# Patient Record
Sex: Female | Born: 1995 | Race: White | Hispanic: No | Marital: Single | Smoking: Current every day smoker
Health system: Southern US, Community
[De-identification: ages and names within clinical notes are randomized; demographics above are authoritative.]

## PROBLEM LIST (undated history)

## (undated) DIAGNOSIS — R109 Unspecified abdominal pain: Secondary | ICD-10-CM

## (undated) DIAGNOSIS — F32A Depression, unspecified: Secondary | ICD-10-CM

## (undated) DIAGNOSIS — F3181 Bipolar II disorder: Secondary | ICD-10-CM

## (undated) DIAGNOSIS — K529 Noninfective gastroenteritis and colitis, unspecified: Secondary | ICD-10-CM

## (undated) DIAGNOSIS — F419 Anxiety disorder, unspecified: Secondary | ICD-10-CM

## (undated) HISTORY — PX: SHOULDER SURGERY: SHX246

## (undated) HISTORY — DX: Noninfective gastroenteritis and colitis, unspecified: K52.9

## (undated) HISTORY — DX: Unspecified abdominal pain: R10.9

---
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
## 2018-01-13 ENCOUNTER — Other Ambulatory Visit: Payer: Self-pay | Admitting: Neurosurgery

## 2018-01-13 DIAGNOSIS — M542 Cervicalgia: Secondary | ICD-10-CM

## 2018-01-18 ENCOUNTER — Ambulatory Visit
Admission: RE | Admit: 2018-01-18 | Discharge: 2018-01-18 | Disposition: A | Discharge status: Home / Self Care | Payer: BLUE CROSS/BLUE SHIELD | Source: Ambulatory Visit | Attending: Neurosurgery | Admitting: Neurosurgery

## 2018-01-18 DIAGNOSIS — M542 Cervicalgia: Secondary | ICD-10-CM

## 2018-03-15 ENCOUNTER — Encounter (HOSPITAL_COMMUNITY): Payer: Self-pay | Admitting: Emergency Medicine

## 2018-03-15 ENCOUNTER — Emergency Department (HOSPITAL_COMMUNITY)
Admission: EM | Admit: 2018-03-15 | Discharge: 2018-03-16 | Disposition: A | Discharge status: Home / Self Care | Payer: BLUE CROSS/BLUE SHIELD | Source: Home / Self Care | Attending: Emergency Medicine | Admitting: Emergency Medicine

## 2018-03-15 ENCOUNTER — Other Ambulatory Visit: Payer: Self-pay

## 2018-03-15 DIAGNOSIS — Z23 Encounter for immunization: Secondary | ICD-10-CM | POA: Insufficient documentation

## 2018-03-15 DIAGNOSIS — Z7722 Contact with and (suspected) exposure to environmental tobacco smoke (acute) (chronic): Secondary | ICD-10-CM

## 2018-03-15 DIAGNOSIS — F3131 Bipolar disorder, current episode depressed, mild: Secondary | ICD-10-CM

## 2018-03-15 DIAGNOSIS — Z7984 Long term (current) use of oral hypoglycemic drugs: Secondary | ICD-10-CM

## 2018-03-15 DIAGNOSIS — Z7289 Other problems related to lifestyle: Secondary | ICD-10-CM

## 2018-03-15 DIAGNOSIS — Z79899 Other long term (current) drug therapy: Secondary | ICD-10-CM | POA: Insufficient documentation

## 2018-03-15 DIAGNOSIS — F314 Bipolar disorder, current episode depressed, severe, without psychotic features: Secondary | ICD-10-CM | POA: Diagnosis not present

## 2018-03-15 LAB — RAPID URINE DRUG SCREEN, HOSP PERFORMED
Amphetamines: NOT DETECTED
Barbiturates: NOT DETECTED
Benzodiazepines: POSITIVE — AB
Cocaine: NOT DETECTED
Opiates: NOT DETECTED
Tetrahydrocannabinol: POSITIVE — AB

## 2018-03-15 LAB — COMPREHENSIVE METABOLIC PANEL
ALK PHOS: 72 U/L (ref 38–126)
ALT: 37 U/L (ref 0–44)
AST: 36 U/L (ref 15–41)
Albumin: 3.9 g/dL (ref 3.5–5.0)
Anion gap: 10 (ref 5–15)
BUN: 7 mg/dL (ref 6–20)
CO2: 23 mmol/L (ref 22–32)
Calcium: 8.7 mg/dL — ABNORMAL LOW (ref 8.9–10.3)
Chloride: 107 mmol/L (ref 98–111)
Creatinine, Ser: 0.8 mg/dL (ref 0.44–1.00)
GFR calc Af Amer: >60 mL/min (ref >60)
GFR calc non Af Amer: >60 mL/min (ref >60)
Glucose, Bld: 109 mg/dL — ABNORMAL HIGH (ref 70–99)
Potassium: 3.5 mmol/L (ref 3.5–5.1)
SODIUM: 140 mmol/L (ref 135–145)
Total Bilirubin: 0.4 mg/dL (ref 0.3–1.2)
Total Protein: 6.9 g/dL (ref 6.5–8.1)

## 2018-03-15 LAB — I-STAT BETA HCG BLOOD, ED (MC, WL, AP ONLY)

## 2018-03-15 LAB — CBC
HCT: 41.7 % (ref 36.0–46.0)
HEMOGLOBIN: 13.5 g/dL (ref 12.0–15.0)
MCH: 28.2 pg (ref 26.0–34.0)
MCHC: 32.4 g/dL (ref 30.0–36.0)
MCV: 87.2 fL (ref 80.0–100.0)
Platelets: 284 10*3/uL (ref 150–400)
RBC: 4.78 MIL/uL (ref 3.87–5.11)
RDW: 12.2 % (ref 11.5–15.5)
WBC: 8.2 10*3/uL (ref 4.0–10.5)
nRBC: 0 % (ref 0.0–0.2)

## 2018-03-15 LAB — ACETAMINOPHEN LEVEL: Acetaminophen (Tylenol), Serum: <10 ug/mL — ABNORMAL LOW (ref 10–30)

## 2018-03-15 LAB — ETHANOL: Alcohol, Ethyl (B): <10 mg/dL (ref <10)

## 2018-03-15 LAB — SALICYLATE LEVEL: Salicylate Lvl: <7 mg/dL (ref 2.8–30.0)

## 2018-03-15 MED ORDER — VITAMIN D (ERGOCALCIFEROL) 1.25 MG (50000 UNIT) PO CAPS: 50000.0000 [IU] | ORAL_CAPSULE | ORAL | Status: DC

## 2018-03-15 MED ORDER — METFORMIN HCL 500 MG PO TABS: 500.0000 mg | ORAL_TABLET | Freq: Every day | ORAL | Status: DC

## 2018-03-15 MED ORDER — TETANUS-DIPHTH-ACELL PERTUSSIS 5-2.5-18.5 LF-MCG/0.5 IM SUSP
0.5000 mL | Freq: Once | INTRAMUSCULAR | Status: AC
  Administered 2018-03-15: 0.5 mL via INTRAMUSCULAR
  Filled 2018-03-15: qty 0.5

## 2018-03-15 MED ORDER — BACITRACIN ZINC 500 UNIT/GM EX OINT
TOPICAL_OINTMENT | Freq: Two times a day (BID) | CUTANEOUS | Status: DC
  Administered 2018-03-15: 1 via TOPICAL
  Filled 2018-03-15: qty 0.9

## 2018-03-15 MED ORDER — ZOLPIDEM TARTRATE 5 MG PO TABS
5.0000 mg | ORAL_TABLET | Freq: Every evening | ORAL | Status: DC | PRN
  Administered 2018-03-15: 5 mg via ORAL
  Filled 2018-03-15: qty 1

## 2018-03-15 MED ORDER — OLANZAPINE-FLUOXETINE HCL 3-25 MG PO CAPS
1.0000 | ORAL_CAPSULE | Freq: Every evening | ORAL | Status: DC
  Administered 2018-03-15: 1 via ORAL
  Filled 2018-03-15: qty 1

## 2018-03-15 MED ORDER — ALPRAZOLAM 0.5 MG PO TABS
0.5000 mg | ORAL_TABLET | Freq: Two times a day (BID) | ORAL | Status: DC | PRN
  Administered 2018-03-15: 0.5 mg via ORAL
  Filled 2018-03-15: qty 1

## 2018-03-15 MED ORDER — PHENTERMINE HCL 15 MG PO CAPS: 15.0000 mg | ORAL_CAPSULE | Freq: Every day | ORAL | Status: DC

## 2018-03-15 NOTE — Progress Notes (Signed)
Per Nira ConnJason Berry, NP pt is recommended for inpt admission. [facility] to review for possible admission. EDP Ward, Chase PicketJaime Pilcher, PA-C and TCU nurse [place], RN have been advised.
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
## 2018-03-15 NOTE — ED Notes (Signed)
Bed: [id number] Expected date:  Expected time:  Means of arrival:  Comments: Triage 4

## 2018-03-15 NOTE — ED Triage Notes (Signed)
Patient wants to kill herself and patient has a hx of self harm.

## 2018-03-15 NOTE — ED Provider Notes (Signed)
[facility]-EMERGENCY DEPT Provider Note   CSN: [id number] Arrival date & time: 03/15/18  2030     History   Chief Complaint Chief Complaint  Patient presents with  . Suicidal    HPI Carla ReasonBeverly H Braun is a 22 y.o. female.  The history is provided by the patient and medical records. No language interpreter was used.   Carla Braun is a 22 y.o. female who presents to the Emergency Department complaining of intentional self-harm.  Unfortunately, she has struggled with this since she was a teenager and has history of cutting herself.  She has been taking all her of her medications as directed without any missed doses.  She states that she does not hurt herself because she wants to kill herself, but that she just does not want to feel the pain and depressed feeling that she is experiencing.  Denies any auditory or visual hallucinations.  No homicidal thoughts.  She has been cleaning all of her lacerations from self injury with soap and water 2-3 times a day.  Past Medical History:  Diagnosis Date  . Abdominal pain   . Inflammation of small intestine    per CT    Patient Active Problem List   Diagnosis Date Noted  . Nausea 01/14/2012  . Chronic constipation 12/26/2011  . Abdominal distention 12/25/2011  . Periumbilical abdominal pain   . MDD (major depressive disorder) 04/14/2011    History reviewed. No pertinent surgical history.   OB History   No obstetric history on file.      Home Medications    Prior to Admission medications   Medication Sig Start Date End Date Taking? Authorizing Provider  ALPRAZolam Prudy Feeler(XANAX) 0.5 MG tablet Take 0.5 mg by mouth 2 (two) times daily as needed for anxiety.    Yes [provider]  ergocalciferol (VITAMIN D2) 1.25 MG (50000 UT) capsule Take 50,000 Units by mouth once a week.    Yes [provider]  levonorgestrel (MIRENA) 20 MCG/24HR IUD 1 each by Intrauterine route once.   Yes [provider]  metFORMIN (GLUCOPHAGE) 500 MG tablet Take 500 mg by mouth daily with breakfast.    Yes [provider]  OLANZapine-FLUoxetine (SYMBYAX) 3-25 MG capsule Take 1 capsule by mouth every evening.    Yes [provider]  phentermine 15 MG capsule Take 15 mg by mouth at bedtime.    Yes [provider]  zaleplon (SONATA) 5 MG capsule Take 5 mg by mouth at bedtime.    Yes [provider]  Fiber CHEW Chew 1 each by mouth daily. 02/17/12 02/16/13  Jon Gillslark, Joseph H, MD  HYDROcodone-acetaminophen (NORCO/VICODIN) 5-325 MG tablet Take 1 tablet by mouth every 6 (six) hours as needed for moderate pain or severe pain.  10/07/13 03/15/18  [provider]    Family History History reviewed. No pertinent family history.  Social History Social History   Tobacco Use  . Smoking status: Passive Smoke Exposure - Never Smoker  . Smokeless tobacco: Never Used  Substance Use Topics  . Alcohol use: No  . Drug use: No     Allergies   Food and Other   Review of Systems Review of Systems  Psychiatric/Behavioral: Positive for self-injury.  All other systems reviewed and are negative.    Physical Exam Updated Vital Signs BP 119/84 (BP Location: Left Arm)   Pulse 91   Temp 98.1 F (36.7 C) (Oral)   Resp 20   Ht 5\' 5"  (  1.651 m)   Wt 93 kg   SpO2 99%   BMI 34.11 kg/m   Physical Exam Vitals signs and nursing note reviewed.  Constitutional:      General: She is not in acute distress.    Appearance: She is well-developed.  HENT:     Head: Normocephalic and atraumatic.  Cardiovascular:     Rate and Rhythm: Normal rate and regular rhythm.     Heart sounds: Normal heart sounds. No murmur.  Pulmonary:     Effort: Pulmonary effort is normal. No respiratory distress.     Breath sounds: Normal breath sounds.  Abdominal:     General: There is no distension.     Palpations: Abdomen is soft.     Tenderness: There is no abdominal tenderness.    Skin:    General: Skin is warm and dry.     Comments: Bilateral upper and lower extremities with several healing lacerations and superficial abrasions and multiple different stages of healing.  None of which have any surrounding erythema or drainage.  No signs of acute infection.  Neurological:     Mental Status: She is alert and oriented to person, place, and time.      ED Treatments / Results  Labs (all labs ordered are listed, but only abnormal results are displayed) Labs Reviewed  COMPREHENSIVE METABOLIC PANEL - Abnormal; Notable for the following components:      Result Value   Glucose, Bld 109 (*)    Calcium 8.7 (*)    All other components within normal limits  ACETAMINOPHEN LEVEL - Abnormal; Notable for the following components:   Acetaminophen (Tylenol), Serum <10 (*)    All other components within normal limits  RAPID URINE DRUG SCREEN, HOSP PERFORMED - Abnormal; Notable for the following components:   Benzodiazepines POSITIVE (*)    Tetrahydrocannabinol POSITIVE (*)    All other components within normal limits  ETHANOL  SALICYLATE LEVEL  CBC  I-STAT BETA HCG BLOOD, ED ([facility], [facility], [facility] ONLY)    EKG None  Radiology No results found.  Procedures Procedures (including critical care time)  Medications Ordered in ED Medications  ALPRAZolam (XANAX) tablet 0.5 mg (has no administration in time range)  ergocalciferol (VITAMIN D2) capsule 50,000 Units (has no administration in time range)  metFORMIN (GLUCOPHAGE) tablet 500 mg (has no administration in time range)  OLANZapine-FLUoxetine (SYMBYAX) 3-25 MG per capsule 1 capsule (has no administration in time range)  phentermine capsule 15 mg (has no administration in time range)  zolpidem (AMBIEN) tablet 5 mg (has no administration in time range)  bacitracin ointment (has no administration in time range)  Tdap (BOOSTRIX) injection 0.5 mL (has no administration in time range)     Initial Impression / Assessment and Plan  / ED Course  I have reviewed the triage vital signs and the nursing notes.  Pertinent labs & imaging results that were available during my care of the patient were reviewed by me and considered in my medical decision making (see chart for details).    MAEOLA MCHANEY is a 22 y.o. female who presents to ED for intentional self harm. Bilateral LE's with multiple lacerations and abrasions at different stages of healing. None of which appear infected. Bacitracin ointment and dressing changes to larger wounds ordered. Tdap ordered to be updated.  Labs reviewed and reassuring. Medically cleared with disposition per TTS recommendations.   Final Clinical Impressions(s) / ED Diagnoses   Final diagnoses:  Deliberate self-cutting    ED  Discharge Orders    None       Jarah Pember, Chase PicketJaime Pilcher, PA-C 03/15/18 2201    Rolan BuccoBelfi, Melanie, MD 03/15/18 2218

## 2018-03-15 NOTE — ED Notes (Signed)
Pt understanding of changing into scrubs and collecting specimens.

## 2018-03-15 NOTE — ED Notes (Signed)
Patient has some old cuts that are healing on both arms and legs. On the right thigh she has a deep cut that is pink and trying to heal.

## 2018-03-15 NOTE — Progress Notes (Signed)
[id number] attending provider Dr. Jola Babinskilary, MD. Call to report [phone]. ED staff advised and [facility]

## 2018-03-16 ENCOUNTER — Encounter (HOSPITAL_COMMUNITY): Payer: Self-pay

## 2018-03-16 ENCOUNTER — Inpatient Hospital Stay (HOSPITAL_COMMUNITY)
Admission: AD | Admit: 2018-03-16 | Discharge: 2018-03-18 | DRG: 885 | Disposition: A | Discharge status: Home / Self Care | Payer: BLUE CROSS/BLUE SHIELD | Source: Intra-hospital | Attending: Psychiatry | Admitting: Psychiatry

## 2018-03-16 DIAGNOSIS — F3181 Bipolar II disorder: Secondary | ICD-10-CM | POA: Diagnosis present

## 2018-03-16 DIAGNOSIS — Z91410 Personal history of adult physical and sexual abuse: Secondary | ICD-10-CM | POA: Diagnosis not present

## 2018-03-16 DIAGNOSIS — Z79899 Other long term (current) drug therapy: Secondary | ICD-10-CM | POA: Diagnosis not present

## 2018-03-16 DIAGNOSIS — F6089 Other specific personality disorders: Secondary | ICD-10-CM | POA: Diagnosis present

## 2018-03-16 DIAGNOSIS — Z7984 Long term (current) use of oral hypoglycemic drugs: Secondary | ICD-10-CM

## 2018-03-16 DIAGNOSIS — G4700 Insomnia, unspecified: Secondary | ICD-10-CM | POA: Diagnosis present

## 2018-03-16 DIAGNOSIS — Z23 Encounter for immunization: Secondary | ICD-10-CM | POA: Diagnosis not present

## 2018-03-16 DIAGNOSIS — Z915 Personal history of self-harm: Secondary | ICD-10-CM

## 2018-03-16 DIAGNOSIS — F3130 Bipolar disorder, current episode depressed, mild or moderate severity, unspecified: Secondary | ICD-10-CM | POA: Diagnosis not present

## 2018-03-16 DIAGNOSIS — Z975 Presence of (intrauterine) contraceptive device: Secondary | ICD-10-CM

## 2018-03-16 DIAGNOSIS — E119 Type 2 diabetes mellitus without complications: Secondary | ICD-10-CM | POA: Diagnosis present

## 2018-03-16 DIAGNOSIS — F17210 Nicotine dependence, cigarettes, uncomplicated: Secondary | ICD-10-CM | POA: Diagnosis present

## 2018-03-16 DIAGNOSIS — E559 Vitamin D deficiency, unspecified: Secondary | ICD-10-CM | POA: Diagnosis present

## 2018-03-16 DIAGNOSIS — F4310 Post-traumatic stress disorder, unspecified: Secondary | ICD-10-CM | POA: Diagnosis present

## 2018-03-16 DIAGNOSIS — R45851 Suicidal ideations: Secondary | ICD-10-CM | POA: Diagnosis present

## 2018-03-16 DIAGNOSIS — F314 Bipolar disorder, current episode depressed, severe, without psychotic features: Secondary | ICD-10-CM | POA: Diagnosis not present

## 2018-03-16 DIAGNOSIS — E739 Lactose intolerance, unspecified: Secondary | ICD-10-CM | POA: Diagnosis present

## 2018-03-16 LAB — LIPID PANEL
Cholesterol: 96 mg/dL (ref 0–200)
HDL: 36 mg/dL — ABNORMAL LOW (ref >40)
LDL CALC: 39 mg/dL (ref 0–99)
TRIGLYCERIDES: 103 mg/dL (ref <150)
Total CHOL/HDL Ratio: 2.7 RATIO
VLDL: 21 mg/dL (ref 0–40)

## 2018-03-16 LAB — HEMOGLOBIN A1C
Hgb A1c MFr Bld: 4.9 % (ref 4.8–5.6)
Mean Plasma Glucose: 93.93 mg/dL

## 2018-03-16 LAB — TSH: TSH: 1.415 u[IU]/mL (ref 0.350–4.500)

## 2018-03-16 MED ORDER — OLANZAPINE-FLUOXETINE HCL 3-25 MG PO CAPS
1.0000 | ORAL_CAPSULE | Freq: Every evening | ORAL | Status: DC
  Filled 2018-03-16: qty 1

## 2018-03-16 MED ORDER — ALPRAZOLAM 0.5 MG PO TABS: 0.5000 mg | ORAL_TABLET | Freq: Two times a day (BID) | ORAL | Status: DC | PRN

## 2018-03-16 MED ORDER — CLONIDINE HCL 0.1 MG PO TABS
0.1000 mg | ORAL_TABLET | Freq: Every day | ORAL | Status: DC
  Administered 2018-03-17: 0.1 mg via ORAL
  Filled 2018-03-16 (×3): qty 1

## 2018-03-16 MED ORDER — PNEUMOCOCCAL VAC POLYVALENT 25 MCG/0.5ML IJ INJ
0.5000 mL | INJECTION | INTRAMUSCULAR | Status: AC
  Administered 2018-03-17: 0.5 mL via INTRAMUSCULAR

## 2018-03-16 MED ORDER — ALUM & MAG HYDROXIDE-SIMETH 200-200-20 MG/5ML PO SUSP: 30.0000 mL | ORAL | Status: DC | PRN

## 2018-03-16 MED ORDER — INFLUENZA VAC SPLIT QUAD 0.5 ML IM SUSY
0.5000 mL | PREFILLED_SYRINGE | INTRAMUSCULAR | Status: AC
  Administered 2018-03-17: 0.5 mL via INTRAMUSCULAR
  Filled 2018-03-16: qty 0.5

## 2018-03-16 MED ORDER — BACITRACIN-NEOMYCIN-POLYMYXIN OINTMENT TUBE
TOPICAL_OINTMENT | Freq: Two times a day (BID) | CUTANEOUS | Status: DC
  Administered 2018-03-16 – 2018-03-18 (×3): via TOPICAL
  Filled 2018-03-16 (×2): qty 14.17

## 2018-03-16 MED ORDER — OLANZAPINE 5 MG PO TABS: 5.0000 mg | ORAL_TABLET | Freq: Every day | ORAL | Status: DC

## 2018-03-16 MED ORDER — FLUOXETINE HCL 20 MG PO CAPS: 20.0000 mg | ORAL_CAPSULE | Freq: Every day | ORAL | Status: DC

## 2018-03-16 MED ORDER — TRAZODONE HCL 50 MG PO TABS
50.0000 mg | ORAL_TABLET | Freq: Every evening | ORAL | Status: DC | PRN
  Administered 2018-03-16: 50 mg via ORAL

## 2018-03-16 MED ORDER — ACETAMINOPHEN 325 MG PO TABS
650.0000 mg | ORAL_TABLET | Freq: Four times a day (QID) | ORAL | Status: DC | PRN
  Administered 2018-03-16 – 2018-03-17 (×3): 650 mg via ORAL
  Filled 2018-03-16 (×3): qty 2

## 2018-03-16 MED ORDER — BACITRACIN-NEOMYCIN-POLYMYXIN 400-5-5000 EX OINT
TOPICAL_OINTMENT | Freq: Two times a day (BID) | CUTANEOUS | Status: DC
  Administered 2018-03-16: 11:00:00 via TOPICAL

## 2018-03-16 MED ORDER — BACITRACIN ZINC 500 UNIT/GM EX OINT
TOPICAL_OINTMENT | Freq: Two times a day (BID) | CUTANEOUS | Status: DC
  Administered 2018-03-16: 1 via TOPICAL
  Administered 2018-03-16 – 2018-03-17 (×2): via TOPICAL
  Filled 2018-03-16 (×2): qty 28.35

## 2018-03-16 MED ORDER — VITAMIN D (ERGOCALCIFEROL) 1.25 MG (50000 UNIT) PO CAPS
50000.0000 [IU] | ORAL_CAPSULE | ORAL | Status: DC
  Administered 2018-03-17: 50000 [IU] via ORAL
  Filled 2018-03-16: qty 1

## 2018-03-16 MED ORDER — HYDROXYZINE HCL 25 MG PO TABS
25.0000 mg | ORAL_TABLET | Freq: Three times a day (TID) | ORAL | Status: DC | PRN
  Administered 2018-03-16: 25 mg via ORAL
  Filled 2018-03-16 (×2): qty 1

## 2018-03-16 MED ORDER — OLANZAPINE 5 MG PO TABS
5.0000 mg | ORAL_TABLET | Freq: Every day | ORAL | Status: DC
  Administered 2018-03-16: 5 mg via ORAL
  Filled 2018-03-16 (×2): qty 1

## 2018-03-16 MED ORDER — FLUOXETINE HCL 20 MG PO CAPS
20.0000 mg | ORAL_CAPSULE | Freq: Every day | ORAL | Status: DC
  Administered 2018-03-16: 20 mg via ORAL
  Filled 2018-03-16 (×2): qty 1

## 2018-03-16 MED ORDER — MAGNESIUM HYDROXIDE 400 MG/5ML PO SUSP: 30.0000 mL | Freq: Every day | ORAL | Status: DC | PRN

## 2018-03-16 MED ORDER — FLUOXETINE HCL 20 MG PO CAPS
20.0000 mg | ORAL_CAPSULE | Freq: Every day | ORAL | Status: DC
  Filled 2018-03-16: qty 1

## 2018-03-16 MED ORDER — OLANZAPINE 2.5 MG PO TABS
2.5000 mg | ORAL_TABLET | Freq: Every day | ORAL | Status: DC
  Filled 2018-03-16: qty 1

## 2018-03-16 MED ORDER — METFORMIN HCL 500 MG PO TABS
500.0000 mg | ORAL_TABLET | Freq: Every day | ORAL | Status: DC
  Administered 2018-03-16 – 2018-03-18 (×3): 500 mg via ORAL
  Filled 2018-03-16: qty 1
  Filled 2018-03-16: qty 7
  Filled 2018-03-16 (×3): qty 1

## 2018-03-16 MED ORDER — NICOTINE 14 MG/24HR TD PT24
14.0000 mg | MEDICATED_PATCH | Freq: Every day | TRANSDERMAL | Status: DC
  Administered 2018-03-16 – 2018-03-18 (×3): 14 mg via TRANSDERMAL
  Filled 2018-03-16 (×4): qty 1
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
## 2018-03-16 NOTE — BHH Group Notes (Signed)
LCSW Group Therapy Note 03/16/2018 10:46 AM  Type of Therapy and Topic: Group Therapy: Overcoming Obstacles  Participation Level: Did Not Attend  Description of Group:  In this group patients will be encouraged to explore what they see as obstacles to their own wellness and recovery. They will be guided to discuss their thoughts, feelings, and behaviors related to these obstacles. The group will process together ways to cope with barriers, with attention given to specific choices patients can make. Each patient will be challenged to identify changes they are motivated to make in order to overcome their obstacles. This group will be process-oriented, with patients participating in exploration of their own experiences as well as giving and receiving support and challenge from other group members.  Therapeutic Goals: 1. Patient will identify personal and current obstacles as they relate to admission. 2. Patient will identify barriers that currently interfere with their wellness or overcoming obstacles.  3. Patient will identify feelings, thought process and behaviors related to these barriers. 4. Patient will identify two changes they are willing to make to overcome these obstacles:   Summary of Patient Progress  Invited, chose not to attend.     Therapeutic Modalities:  Cognitive Behavioral Therapy Solution Focused Therapy Motivational Interviewing Relapse Prevention Therapy   Alcario DroughtJolan Rahman Ferrall LCSWA Clinical Social Worker
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
## 2018-03-16 NOTE — Plan of Care (Signed)
Nurse discussed anxiety, depression and coping skills with patient.  

## 2018-03-16 NOTE — Progress Notes (Addendum)
D:  Patient's self inventory sheet, patient has fair sleep, no sleep medication.  Poor appetite, low energy level, good concentration.  Rated depression 3, denied hopeless and anxiety.  Denied withdrawals. Denied SI.  Denied physical problems.  Denied physical pain.  Goal is open up and get right help.  Plans to talk to MDs.  Does have discharge plans. A:  Medications administered per MD orders.  Emotional support and encouragement given patient. R:  Denied SI and HI, contracts for safety.  Denied A/V hallucinations.  Safety maintained with 15 minute checks.

## 2018-03-16 NOTE — BHH Group Notes (Signed)
Adult Psychoeducational Group Note  Date:  03/16/2018 Time:  10:58 PM  Group Topic/Focus:  Wrap-Up Group:   The focus of this group is to help patients review their daily goal of treatment and discuss progress on daily workbooks.  Participation Level:  Active  Participation Quality:  Appropriate and Attentive  Affect:  Appropriate  Cognitive:  Alert and Appropriate  Insight: Appropriate and Good  Engagement in Group:  Engaged  Modes of Intervention:  Discussion and Education  Additional Comments:  Pt attended and participated in wrap up group this evening. Pt rated their day a 9/10, due to them having a better day today than they did yesterday. Pt got a lot of sleep and had great talks with close family members. Pt completed their goal, which was to get themselves out of their negative thoughts. Pt completed their goal by working in their journal and talking with family.   Carla NettersOctavia A Million Braun 03/16/2018, 10:58 PM

## 2018-03-16 NOTE — BHH Group Notes (Signed)
Adult Psychoeducational Group Note  Date:  03/16/2018 Time:  9:08 AM  Group Topic/Focus:  Goals Group:   The focus of this group is to help patients establish daily goals to achieve during treatment and discuss how the patient can incorporate goal setting into their daily lives to aide in recovery.  Participation Level:  Active  Participation Quality:  Appropriate  Affect:  Appropriate  Cognitive:  Alert  Insight: Appropriate  Engagement in Group:  Engaged  Modes of Intervention:  Orientation  Additional Comments:  Pt attended and participated in orientation/goals group. Pt goal for today is to develop coping mechanisms.   Dellia NimsJaquesha M Beldon Nowling 03/16/2018, 9:08 AM
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
## 2018-03-16 NOTE — Progress Notes (Addendum)
Recreation Therapy Notes  Date: 12.23.19 Time: 0930 Location: [address] Dayroom  Group Topic: Stress Management  Goal Area(s) Addresses:  Patient will verbalize importance of using healthy stress management.  Patient will identify positive emotions associated with healthy stress management.   Behavioral Response: Engaged  Intervention: Stress Management  Activity :  Meditation.  LRT introduced the stress management technique of meditation.  LRT played a meditation that allowed patients to focus on the possibilities the day holds.  Patients were to follow along as the meditation played to engage in the activity.  Education:  Stress Management, Discharge Planning.   Education Outcome: Acknowledges edcuation/In group clarification offered/Needs additional education  Clinical Observations/Feedback: Pt attended and was engaged in group.     Jennavecia Schwier, LRT/CTRS         Glendell Fouse A 03/16/2018 10:56 AM 

## 2018-03-16 NOTE — Tx Team (Signed)
Initial Treatment Plan 03/16/2018 4:09 AM Carla ReasonBeverly H Beaufort WGN:[id number]    PATIENT STRESSORS: Financial difficulties Health problems Marital or family conflict   PATIENT STRENGTHS: Average or above average intelligence Capable of independent living Communication skills Supportive family/friends   PATIENT IDENTIFIED PROBLEMS: "Work on anger"  "learn coping mechanisms"                    DISCHARGE CRITERIA:  Improved stabilization in mood, thinking, and/or behavior Need for constant or close observation no longer present Reduction of life-threatening or endangering symptoms to within safe limits  PRELIMINARY DISCHARGE PLAN: Return to previous living arrangement  PATIENT/FAMILY INVOLVEMENT: This treatment plan has been presented to and reviewed with the patient, Carla Braun, and/or family member.  The patient and family have been given the opportunity to ask questions and make suggestions.  Edwyna PerfectHeather C Latha Staunton, RN 03/16/2018, 4:09 AM

## 2018-03-16 NOTE — Tx Team (Signed)
Interdisciplinary Treatment and Diagnostic Plan Update  03/16/2018 Time of Session:  Carla Braun MRN: [id number]  Principal Diagnosis: <principal problem not specified>  Secondary Diagnoses: Active Problems:   Bipolar 1 disorder, depressed, severe (HCC)   Current Medications:  Current Facility-Administered Medications  Medication Dose Route Frequency Provider Last Rate Last Dose  . acetaminophen (TYLENOL) tablet 650 mg  650 mg Oral Q6H PRN Rozetta Nunnery, NP      . alum & mag hydroxide-simeth (MAALOX/MYLANTA) 200-200-20 MG/5ML suspension 30 mL  30 mL Oral Q4H PRN Rozetta Nunnery, NP      . bacitracin ointment   Topical BID Rozetta Nunnery, NP   1 application at 17/79/39 0820  . hydrOXYzine (ATARAX/VISTARIL) tablet 25 mg  25 mg Oral TID PRN Rozetta Nunnery, NP      . Derrill Memo ON 03/17/2018] Influenza vac split quadrivalent PF (FLUARIX) injection 0.5 mL  0.5 mL Intramuscular Tomorrow-1000 Lindon Romp A, NP      . magnesium hydroxide (MILK OF MAGNESIA) suspension 30 mL  30 mL Oral Daily PRN Lindon Romp A, NP      . metFORMIN (GLUCOPHAGE) tablet 500 mg  500 mg Oral Q breakfast Lindon Romp A, NP   500 mg at 03/16/18 0819  . nicotine (NICODERM CQ - dosed in mg/24 hours) patch 14 mg  14 mg Transdermal Daily Lindon Romp A, NP   14 mg at 03/16/18 0818  . OLANZapine-FLUoxetine (SYMBYAX) 3-25 MG per capsule 1 capsule  1 capsule Oral QPM Lindon Romp A, NP      . [START ON 03/17/2018] pneumococcal 23 valent vaccine (PNU-IMMUNE) injection 0.5 mL  0.5 mL Intramuscular Tomorrow-1000 Lindon Romp A, NP      . traZODone (DESYREL) tablet 50 mg  50 mg Oral QHS PRN Rozetta Nunnery, NP      . Derrill Memo ON 03/17/2018] Vitamin D (Ergocalciferol) (DRISDOL) capsule 50,000 Units  50,000 Units Oral Weekly Lindon Romp A, NP       PTA Medications: Medications Prior to Admission  Medication Sig Dispense Refill Last Dose  . ALPRAZolam (XANAX) 0.5 MG tablet Take 0.5 mg by mouth 2 (two) times daily as needed for  anxiety.    03/14/2018 at Unknown time  . ergocalciferol (VITAMIN D2) 1.25 MG (50000 UT) capsule Take 50,000 Units by mouth once a week.    03/10/2018 at unknown time  . Fiber CHEW Chew 1 each by mouth daily. 100 tablet 0   . levonorgestrel (MIRENA) 20 MCG/24HR IUD 1 each by Intrauterine route once.   ongoing  . metFORMIN (GLUCOPHAGE) 500 MG tablet Take 500 mg by mouth daily with breakfast.    03/15/2018 at Unknown time  . OLANZapine-FLUoxetine (SYMBYAX) 3-25 MG capsule Take 1 capsule by mouth every evening.    03/14/2018 at Unknown time  . phentermine 15 MG capsule Take 15 mg by mouth at bedtime.    03/14/2018 at Unknown time  . zaleplon (SONATA) 5 MG capsule Take 5 mg by mouth at bedtime.    03/14/2018 at Unknown time    Patient Stressors: Financial difficulties Health problems Marital or family conflict  Patient Strengths: Average or above average intelligence Capable of independent living Communication skills Supportive family/friends  Treatment Modalities: Medication Management, Group therapy, Case management,  1 to 1 session with clinician, Psychoeducation, Recreational therapy.   Physician Treatment Plan for Primary Diagnosis: <principal problem not specified> Long Term Goal(s):     Short Term Goals:    Medication Management: Evaluate patient's  response, side effects, and tolerance of medication regimen.  Therapeutic Interventions: 1 to 1 sessions, Unit Group sessions and Medication administration.  Evaluation of Outcomes: Not Met  Physician Treatment Plan for Secondary Diagnosis: Active Problems:   Bipolar 1 disorder, depressed, severe ([place])  Long Term Goal(s):     Short Term Goals:       Medication Management: Evaluate patient's response, side effects, and tolerance of medication regimen.  Therapeutic Interventions: 1 to 1 sessions, Unit Group sessions and Medication administration.  Evaluation of Outcomes: Not Met   RN Treatment Plan for Primary Diagnosis:  <principal problem not specified> Long Term Goal(s): Knowledge of disease and therapeutic regimen to maintain health will improve  Short Term Goals: Ability to remain free from injury will improve, Ability to participate in decision making will improve, Ability to verbalize feelings will improve, Ability to disclose and discuss suicidal ideas and Ability to identify and develop effective coping behaviors will improve  Medication Management: RN will administer medications as ordered by provider, will assess and evaluate patient's response and provide education to patient for prescribed medication. RN will report any adverse and/or side effects to prescribing provider.  Therapeutic Interventions: 1 on 1 counseling sessions, Psychoeducation, Medication administration, Evaluate responses to treatment, Monitor vital signs and CBGs as ordered, Perform/monitor CIWA, COWS, AIMS and Fall Risk screenings as ordered, Perform wound care treatments as ordered.  Evaluation of Outcomes: Not Met    LCSW Treatment Plan for Primary Diagnosis: <principal problem not specified> Long Term Goal(s): Safe transition to appropriate next level of care at discharge, Engage patient in therapeutic group addressing interpersonal concerns.  Short Term Goals: Engage patient in aftercare planning with referrals and resources  Therapeutic Interventions: Assess for all discharge needs, 1 to 1 time with Social worker, Explore available resources and support systems, Assess for adequacy in community support network, Educate family and significant other(s) on suicide prevention, Complete Psychosocial Assessment, Interpersonal group therapy.  Evaluation of Outcomes: Not Met   Progress in Treatment: Attending groups: No. Participating in groups: No. Taking medication as prescribed: Yes. Toleration medication: Yes. Family/Significant other contact made: No, will contact:  the patient's mother Patient understands diagnosis:  Yes. Discussing patient identified problems/goals with staff: Yes. Medical problems stabilized or resolved: Yes. Denies suicidal/homicidal ideation: Yes. Issues/concerns per patient self-inventory: No. Other:   New problem(s) identified: None   New Short Term/Long Term Goal(s): medication stabilization, elimination of SI thoughts, development of comprehensive mental wellness plan.    Patient Goals:  "To focus on how to get better and learn coping skills"   Discharge Plan or Barriers: Patient lives in [place], [place] alone. CSW will continue to follow for appropriate referrals and possible discharge planning.   Reason for Continuation of Hospitalization: Anxiety Depression Medication stabilization Suicidal ideation  Estimated Length of Stay: 3-5 days   Attendees: Patient: Carla Braun 03/16/2018 8:30 AM  Physician: Dr. Myles Lipps, MD 03/16/2018 8:30 AM  Nursing: Rise Paganini. Raliegh Ip, RN 03/16/2018 8:30 AM  RN Care Manager: 03/16/2018 8:30 AM  Social Worker: Radonna Ricker, [place]  03/16/2018 8:30 AM  Recreational Therapist:  03/16/2018 8:30 AM  Other:  03/16/2018 8:30 AM  Other:  03/16/2018 8:30 AM  Other: 03/16/2018 8:30 AM    Scribe for Treatment Team: Marylee Floras, [place] 03/16/2018 8:30 AM

## 2018-03-17 DIAGNOSIS — F314 Bipolar disorder, current episode depressed, severe, without psychotic features: Principal | ICD-10-CM

## 2018-03-17 MED ORDER — VORTIOXETINE HBR 10 MG PO TABS
10.0000 mg | ORAL_TABLET | Freq: Every day | ORAL | Status: DC
  Administered 2018-03-17: 10 mg via ORAL
  Filled 2018-03-17 (×3): qty 1

## 2018-03-17 MED ORDER — ZOLPIDEM TARTRATE 5 MG PO TABS
10.0000 mg | ORAL_TABLET | Freq: Every day | ORAL | Status: DC
  Administered 2018-03-17: 10 mg via ORAL
  Filled 2018-03-17: qty 2

## 2018-03-17 MED ORDER — CARBAMAZEPINE 100 MG PO CHEW
100.0000 mg | CHEWABLE_TABLET | Freq: Three times a day (TID) | ORAL | Status: DC
  Administered 2018-03-17 – 2018-03-18 (×3): 100 mg via ORAL
  Filled 2018-03-17 (×2): qty 1
  Filled 2018-03-17 (×2): qty 9
  Filled 2018-03-17 (×3): qty 1
  Filled 2018-03-17: qty 9
  Filled 2018-03-17: qty 1

## 2018-03-17 NOTE — Progress Notes (Signed)
Nursing Progress Note: 7p-7a D: Pt currently presents with a depressed/anxious affect and behavior. Pt states "I had a good day. I struggle with sleep a lot though." Interacting appropriately with the milieu. Pt reports poorsleep during the previous night. Pt did attend wrap-up group.  A: Pt provided with medications per providers orders. Pt's labs and vitals were monitored throughout the night. Pt supported emotionally and encouraged to express concerns and questions. Pt educated on medications.  R: Pt's safety ensured with 15 minute and environmental checks. Pt currently denies SI, HI, and AVH. Pt verbally contracts to seek staff if SI,HI, or AVH occurs and to consult with staff before acting on any harmful thoughts. Will continue to monitor.
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
## 2018-03-17 NOTE — Progress Notes (Signed)
Recreation Therapy Notes  Animal-Assisted Activity (AAA) Program Checklist/Progress Notes Patient Eligibility Criteria Checklist & Daily Group note for Rec Tx Intervention  Date: 12.24.19 Time: 1100 [address]   AAA/T Program Assumption of Risk Form signed by Engineer, productionatient/ or Parent Legal Guardian  YES   Patient is free of allergies or sever asthma  YES   Patient reports no fear of animals  YES   Patient reports no history of cruelty to animals  YES   Patient understands his/her participation is voluntary  YES   Patient washes hands before animal contact  YES   Patient washes hands after animal contact  YES   Education: Charity fundraiserHand Washing, Appropriate Animal Interaction   Education Outcome: Acknowledges understanding/In group clarification offered/Needs additional education.   Clinical Observations/Feedback: Pt did not attend group.    Caroll RancherMarjette Minie Roadcap, LRT/CTRS     Caroll RancherLindsay, Rosealynn Mateus A 03/17/2018 11:56 AM
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
## 2018-03-17 NOTE — Progress Notes (Signed)
DAR Note: Pt visible in dayroom majority of this shift. A & O X4. Denies SI, HI, AVH and pain when assessed. Rates her depression 0/10, hopelessness 0/10 and anxiety 2/10 on self inventory sheet. Per pt "I'm ok right now". Reports she's sleeping well with good appetite, normal energy and good concentration level. Pt did not attend group this shift despite multiple prompts. Pt's goal for today is "focus on being positive". Remains medication compliant. Denies side effects. Emotional support and encouragement provided to pt. Safety checks maintained at Q 15 minutes intervals without self harm gestures or outburst. All medications given per MD's orders with verbal education and effects monitored.  Pt did not attend groups despite multiple prompts. Tolerates all PO intake well. Denies concerns at this time. Remains safe on and off unit.

## 2018-03-17 NOTE — [facility] Group Notes (Signed)
Adult Psychoeducational Group Note  Date:  03/17/2018 Time:  10:27 PM  Group Topic/Focus:  Wrap-Up Group:   The focus of this group is to help patients review their daily goal of treatment and discuss progress on daily workbooks.  Participation Level:  Active  Participation Quality:  Appropriate and Attentive  Affect:  Appropriate  Cognitive:  Alert and Appropriate  Insight: Appropriate and Good  Engagement in Group:  Engaged  Modes of Intervention:  Discussion and Education  Additional Comments:  Pt attended and participated in wrap up group this evening. Pt rated their day a 10/10, due to their meds working well. Pt has been feeling hyper (happy), which is a good thing because they are no longer feeling depressed.   Chrisandra NettersOctavia A Malani Lees 03/17/2018, 10:27 PM

## 2018-03-18 MED ORDER — VORTIOXETINE HBR 5 MG PO TABS: 5.0000 mg | ORAL_TABLET | Freq: Every day | ORAL | 2 refills | Status: DC

## 2018-03-18 MED ORDER — CARBAMAZEPINE 100 MG PO CHEW: 100.0000 mg | CHEWABLE_TABLET | Freq: Three times a day (TID) | ORAL | 2 refills | Status: DC

## 2018-03-18 MED ORDER — VORTIOXETINE HBR 5 MG PO TABS
5.0000 mg | ORAL_TABLET | Freq: Every day | ORAL | Status: DC
  Administered 2018-03-18: 5 mg via ORAL
  Filled 2018-03-18: qty 1
  Filled 2018-03-18: qty 3

## 2018-03-18 NOTE — Plan of Care (Signed)
  Problem: Activity: Goal: Will identify at least one activity in which they can participate Outcome: Progressing   Problem: Coping: Goal: Ability to interact with others will improve Outcome: Progressing   

## 2018-03-18 NOTE — Progress Notes (Addendum)
Patient ID: Carla Braun ReasonBeverly H Alleman, female   DOB: June 12, 1995, 22 y.o.   MRN: [id number]  Discharge Note  D) Patient discharged to lobby. Patient states readiness for discharge. Patient denies SI/HI, AVH and is not delusional or psychotic.   A) Written and verbal discharge instructions given to the patient. Patient accepting to information and verbalized understanding. Patient agrees to the discharge plan. Opportunity for questions and concerns presented to patient. Patient denied any further questions or concerns. All belongings returned to patient including $200 from safe. Patient signed for return of belongings and discharge paperwork. Patient has completed their Suicide Safety Plan and has been provided Suicide Prevention Education. Patient provided an opportunity to complete and return Patient Satisfaction Survey.   R) Patient safely escorted to the lobby. [facility] with medication samples, prescriptions, personal belongings, follow-up appointment in place and discharge paperwork.

## 2018-03-18 NOTE — Progress Notes (Signed)
D:  Carla Braun was up and visible on the unit.  She reported having a good visit with her mother, sister and her sister's children this evening.  She denied SI/HI or A/V hallucinations.  She reported her day was much better and is hoping to go home soon. "I am really glad that I came in because the medications weren't helping and now I feel like they are starting to work."  She reported her energy has increased.  She continues to report pain in her shoulder but declined wanting medication at this time.  Offered heat/ice pack and she stated that she would be ok. She did question about PT that she is supposed to be doing for her shoulder and urged her to talk with the doctor tomorrow about having PT visit her here for recommendations.  She took her hs medications without difficulty.  She is currently resting with her eyes closed and appears to be asleep. A:  1:1 with RN for support and encouragement.  Medications as ordered.  Q 15 minute checks maintained for safety.  Encouraged participation in group and unit activities.  Provided her with a list of medication so she can have her mother look them up an BC/BS for the prices.   R:  Carla Braun remains safe on the unit.  We will continue to monitor the progress towards her goals.

## 2018-03-18 NOTE — B[facility] Suicide Risk Assessment (Signed)
[facility]   Principal Problem: Pression/suicidal thinking Discharge Diagnoses: Active Problems:   Bipolar 1 disorder, depressed, severe (HCC)   Total Time spent with patient: 45 minutes Mental Status Per Nursing Assessment::   On Admission:  Suicidal ideation indicated by patient, Plan includes specific time, place, or method, Suicide plan, Self-harm thoughts, Self-harm behaviors  Demographic Factors:  Caucasian  Loss Factors: Decrease in vocational status  Historical Factors: Impulsivity  Risk Reduction Factors:   NA  Continued Clinical Symptoms:  Dysthymia  Cognitive Features That Contribute To Risk:  None    Suicide Risk:  Minimal: No identifiable suicidal ideation.  Patients presenting with no risk factors but with morbid ruminations; may be classified as minimal risk based on the severity of the depressive symptoms  Follow-up Information    [facility] Go on 03/27/2018.   Why:  Your therapy appointment with Leta JunglingJake is Friday, 03/27/18 at 9:30a. Your medication management appointment with Elon JesterMichele is Tuesday, 03/31/18 at 12:00p. Call within 24 hours of discharge to hold appointment and pay $20 deposit. Contact information: [address] [place] [address] [phone]           Plan Of Care/Follow-up recommendations:  Activity:  full  Ramaya Guile, MD 03/18/2018, 6:50 AM
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
## 2018-03-18 NOTE — Progress Notes (Signed)
Patient ID: Carla Braun, female   DOB: Jul 22, 1995, 22 y.o.   MRN: [id number]  Nursing Progress Note [id number]  Data: On initial approach, patient was seen up in the dayroom interacting with peers. Patient presents calm, pleasant and cooperative with bright affect and animated mood. Patient compliant with scheduled medications. Patient denies pain/physical complaints. Patient completed self-inventory sheet and rates depression, hopelessness, and anxiety 3,0,2 respectively. Patient rates their sleep and appetite as good/good respectively. Patient states goal for today is to "be patient and discharge". Patient is seen attending groups and visible in the milieu. Patient currently denies SI/HI/AVH.   Action: Patient is educated about and provided medication per provider's orders. Patient safety maintained with q15 min safety checks and frequent rounding. Low fall risk precautions in place. Emotional support given. 1:1 interaction and active listening provided. Patient encouraged to attend meals, groups, and work on treatment plan and goals. Labs, vital signs and patient behavior monitored throughout shift.   Response: Patient remains safe on the unit at this time and agrees to come to staff with any issues/concerns. Patient is interacting with peers appropriately on the unit. Will continue to support and monitor.

## 2018-11-11 ENCOUNTER — Inpatient Hospital Stay
Admission: RE | Admit: 2018-11-11 | Discharge: 2018-11-16 | DRG: 885 | Disposition: A | Discharge status: Home / Self Care | Payer: BC Managed Care – PPO | Source: Intra-hospital | Attending: Psychiatry | Admitting: Psychiatry

## 2018-11-11 ENCOUNTER — Other Ambulatory Visit: Payer: Self-pay

## 2018-11-11 ENCOUNTER — Inpatient Hospital Stay
Admission: RE | Admit: 2018-11-11 | Discharge: 2018-11-11 | Discharge status: Absent without leave | Payer: BC Managed Care – PPO | Source: Intra-hospital | Attending: Psychiatry | Admitting: Psychiatry

## 2018-11-11 DIAGNOSIS — Z975 Presence of (intrauterine) contraceptive device: Secondary | ICD-10-CM | POA: Diagnosis not present

## 2018-11-11 DIAGNOSIS — F322 Major depressive disorder, single episode, severe without psychotic features: Secondary | ICD-10-CM | POA: Diagnosis present

## 2018-11-11 DIAGNOSIS — Z7984 Long term (current) use of oral hypoglycemic drugs: Secondary | ICD-10-CM

## 2018-11-11 DIAGNOSIS — F603 Borderline personality disorder: Secondary | ICD-10-CM

## 2018-11-11 DIAGNOSIS — Z915 Personal history of self-harm: Secondary | ICD-10-CM | POA: Diagnosis not present

## 2018-11-11 DIAGNOSIS — Z91011 Allergy to milk products: Secondary | ICD-10-CM

## 2018-11-11 DIAGNOSIS — F3181 Bipolar II disorder: Secondary | ICD-10-CM | POA: Diagnosis present

## 2018-11-11 DIAGNOSIS — G4700 Insomnia, unspecified: Secondary | ICD-10-CM | POA: Diagnosis present

## 2018-11-11 DIAGNOSIS — F17210 Nicotine dependence, cigarettes, uncomplicated: Secondary | ICD-10-CM | POA: Diagnosis present

## 2018-11-11 DIAGNOSIS — S61519A Laceration without foreign body of unspecified wrist, initial encounter: Secondary | ICD-10-CM

## 2018-11-11 MED ORDER — HYDROXYZINE HCL 25 MG PO TABS
25.0000 mg | ORAL_TABLET | Freq: Three times a day (TID) | ORAL | Status: DC | PRN
  Administered 2018-11-11 – 2018-11-14 (×2): 25 mg via ORAL
  Filled 2018-11-11 (×2): qty 1

## 2018-11-11 MED ORDER — ACETAMINOPHEN 325 MG PO TABS
650.0000 mg | ORAL_TABLET | Freq: Four times a day (QID) | ORAL | Status: DC | PRN
  Administered 2018-11-11 – 2018-11-14 (×2): 650 mg via ORAL
  Filled 2018-11-11: qty 2

## 2018-11-11 MED ORDER — HYDROXYZINE HCL 25 MG PO TABS: 25.0000 mg | ORAL_TABLET | Freq: Three times a day (TID) | ORAL | Status: DC | PRN

## 2018-11-11 MED ORDER — MAGNESIUM HYDROXIDE 400 MG/5ML PO SUSP: 30.0000 mL | Freq: Every day | ORAL | Status: DC | PRN

## 2018-11-11 MED ORDER — ALUM & MAG HYDROXIDE-SIMETH 200-200-20 MG/5ML PO SUSP: 30.0000 mL | ORAL | Status: DC | PRN

## 2018-11-11 MED ORDER — TRAZODONE HCL 50 MG PO TABS
50.0000 mg | ORAL_TABLET | Freq: Every evening | ORAL | Status: DC | PRN
  Administered 2018-11-11 – 2018-11-14 (×4): 50 mg via ORAL
  Filled 2018-11-11 (×4): qty 1

## 2018-11-11 NOTE — Plan of Care (Signed)
  Problem: Education: Goal: Knowledge of [facility] Education information/materials will improve Outcome: Progressing   Patient instructed on [facility]  and unit programing , verbalized  understanding

## 2018-11-11 NOTE — Plan of Care (Signed)
Pt seen in the milieu socializing with others. Pt stated she was depressed and did not imagine she would be sent "here". Pt stated she is coping well so far and hopes to go home soon. Pt added that she spoke to her mother on the phone and was expecting a visit from her soon. Pt denies SI/HI/AVH. Q15 min safety checks maintained. Pt med compliant.  Problem: Education: Goal: Knowledge of [facility] Education information/materials will improve Outcome: Progressing   Problem: Coping: Goal: Ability to verbalize frustrations and anger appropriately will improve Outcome: Progressing Goal: Ability to demonstrate self-control will improve Outcome: Progressing   Problem: Safety: Goal: Periods of time without injury will increase Outcome: Progressing   Problem: Education: Goal: Utilization of techniques to improve thought processes will improve Outcome: Progressing Goal: Knowledge of the prescribed therapeutic regimen will improve Outcome: Progressing   Problem: Medication: Goal: Compliance with prescribed medication regimen will improve Outcome: Progressing   Problem: Self-Concept: Goal: Ability to disclose and discuss suicidal ideas will improve Outcome: Progressing Goal: Will verbalize positive feelings about self Outcome: Progressing
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
## 2018-11-11 NOTE — Tx Team (Signed)
Initial Treatment Plan 11/11/2018 6:21 PM Carla Braun ZNB:[id number]    PATIENT STRESSORS: Financial difficulties Health problems Substance abuse   PATIENT STRENGTHS: Ability for insight Active sense of humor Average or above average intelligence Capable of independent living Communication skills Supportive family/friends   PATIENT IDENTIFIED PROBLEMS: Suicidal 11/11/2018  Depression 11/11/2018  Patient is a cutter 11/11/2018                 DISCHARGE CRITERIA:  Ability to meet basic life and health needs Improved stabilization in mood, thinking, and/or behavior     PRELIMINARY DISCHARGE PLAN: Outpatient therapy Return to previous living arrangement  PATIENT/FAMILY INVOLVEMENT: This treatment plan has been presented to and reviewed with the patient, Carla Braun, and/or family member,.  The patient and family have been given the opportunity to ask questions and make suggestions.  Leodis Liverpool, RN 11/11/2018, 6:21 PM

## 2018-11-12 DIAGNOSIS — F3181 Bipolar II disorder: Secondary | ICD-10-CM

## 2018-11-12 DIAGNOSIS — S61519A Laceration without foreign body of unspecified wrist, initial encounter: Secondary | ICD-10-CM

## 2018-11-12 DIAGNOSIS — F603 Borderline personality disorder: Secondary | ICD-10-CM

## 2018-11-12 MED ORDER — ADULT MULTIVITAMIN W/MINERALS CH
1.0000 | ORAL_TABLET | Freq: Every day | ORAL | Status: DC
  Administered 2018-11-13 – 2018-11-16 (×4): 1 via ORAL
  Filled 2018-11-12 (×4): qty 1

## 2018-11-12 MED ORDER — NICOTINE 21 MG/24HR TD PT24
21.0000 mg | MEDICATED_PATCH | Freq: Every day | TRANSDERMAL | Status: DC
  Administered 2018-11-12 – 2018-11-16 (×5): 21 mg via TRANSDERMAL
  Filled 2018-11-12 (×6): qty 1

## 2018-11-12 MED ORDER — CARBAMAZEPINE 200 MG PO TABS
200.0000 mg | ORAL_TABLET | Freq: Two times a day (BID) | ORAL | Status: DC
  Administered 2018-11-12 – 2018-11-16 (×9): 200 mg via ORAL
  Filled 2018-11-12 (×9): qty 1

## 2018-11-12 MED ORDER — FLUOXETINE HCL 20 MG PO CAPS
20.0000 mg | ORAL_CAPSULE | Freq: Every day | ORAL | Status: DC
  Administered 2018-11-12 – 2018-11-16 (×5): 20 mg via ORAL
  Filled 2018-11-12 (×5): qty 1

## 2018-11-12 MED ORDER — IBUPROFEN 600 MG PO TABS
600.0000 mg | ORAL_TABLET | Freq: Four times a day (QID) | ORAL | Status: DC | PRN
  Administered 2018-11-12 – 2018-11-15 (×4): 600 mg via ORAL
  Filled 2018-11-12 (×4): qty 1

## 2018-11-12 NOTE — Plan of Care (Signed)
Pt denies depression, SI, HI and AVH. Pt rates anxiety 3/10. Pt was educated on care plan and verbalizes understanding. Collier Bullock RN Problem: Education: Goal: Knowledge of [facility] Education information/materials will improve Outcome: Progressing   Problem: Coping: Goal: Ability to verbalize frustrations and anger appropriately will improve Outcome: Progressing Goal: Ability to demonstrate self-control will improve Outcome: Progressing   Problem: Safety: Goal: Periods of time without injury will increase Outcome: Progressing   Problem: Education: Goal: Utilization of techniques to improve thought processes will improve Outcome: Progressing Goal: Knowledge of the prescribed therapeutic regimen will improve Outcome: Progressing   Problem: Medication: Goal: Compliance with prescribed medication regimen will improve Outcome: Progressing   Problem: Self-Concept: Goal: Ability to disclose and discuss suicidal ideas will improve Outcome: Progressing Goal: Will verbalize positive feelings about self Outcome: Progressing

## 2018-11-12 NOTE — BHH Suicide Risk Assessment (Signed)
[facility]   Nursing information obtained from:  Patient Demographic factors:  Low socioeconomic status, Caucasian, Unemployed Current Mental Status:  Self-harm thoughts Loss Factors:  NA Historical Factors:  Prior suicide attempts Risk Reduction Factors:  Positive social support  Total Time spent with patient: 1 hour Principal Problem: <principal problem not specified> Diagnosis:  Active Problems:   MDD (major depressive disorder), severe (HCC)  Subjective Data: Patient seen chart reviewed.  Patient has a history of self-mutilation and bipolar disorder.  Came into the hospital after cutting her arms and experiencing suicidal ideation.  Patient is not having any psychotic symptoms.  Denies recent abuse of drugs other than some marijuana.  Cooperative with treatment.  Continued Clinical Symptoms:  Alcohol Use Disorder Identification Test Final Score (AUDIT): 0 The "Alcohol Use Disorders Identification Test", Guidelines for Use in Primary Care, Second Edition.  World Science writerHealth Organization [facility]WHO). Score between 0-7:  no or low risk or alcohol related problems. Score between 8-15:  moderate risk of alcohol related problems. Score between 16-19:  high risk of alcohol related problems. Score 20 or above:  warrants further diagnostic evaluation for alcohol dependence and treatment.   CLINICAL FACTORS:   Bipolar Disorder:   Bipolar II   Musculoskeletal: Strength & Muscle Tone: within normal limits Gait & Station: normal Patient leans: N/A  Psychiatric Specialty Exam: Physical Exam  Nursing note and vitals reviewed. Constitutional: She appears well-developed and well-nourished.  HENT:  Head: Normocephalic and atraumatic.  Eyes: Pupils are equal, round, and reactive to light. Conjunctivae are normal.  Neck: Normal range of motion.  Cardiovascular: Regular rhythm and normal heart sounds.  Respiratory: Effort normal. No respiratory distress.  GI: Soft.   Musculoskeletal: Normal range of motion.  Neurological: She is alert.  Skin: Skin is warm and dry.  Psychiatric: Her speech is normal and behavior is normal. Judgment normal. Her mood appears anxious. Cognition and memory are normal. She exhibits a depressed mood. She expresses suicidal ideation. She expresses no suicidal plans.    Review of Systems  Constitutional: Negative.   HENT: Negative.   Eyes: Negative.   Respiratory: Negative.   Cardiovascular: Negative.   Gastrointestinal: Negative.   Musculoskeletal: Negative.   Skin: Negative.   Neurological: Negative.   Psychiatric/Behavioral: Positive for depression, memory loss and suicidal ideas. Negative for hallucinations and substance abuse. The patient is nervous/anxious and has insomnia.     Blood pressure 109/76, pulse 85, temperature 98 F (36.7 C), temperature source Oral, resp. rate 18, height 5\' 5"  (1.651 m), weight 83.5 kg, SpO2 100 %.Body mass index is 30.62 kg/m.  General Appearance: Casual  Eye Contact:  Good  Speech:  Clear and Coherent  Volume:  Normal  Mood:  Depressed and Dysphoric  Affect:  Congruent  Thought Process:  Goal Directed  Orientation:  Full (Time, Place, and Person)  Thought Content:  Logical  Suicidal Thoughts:  Yes.  with intent/plan  Homicidal Thoughts:  No  Memory:  Immediate;   Fair Recent;   Fair Remote;   Fair  Judgement:  Fair  Insight:  Fair  Psychomotor Activity:  Normal  Concentration:  Concentration: Fair  Recall:  [employer]  Fund of Knowledge:  Fair  Language:  Fair  Akathisia:  No  Handed:  Right  AIMS (if indicated):     Assets:  Desire for Improvement Housing Physical Health Resilience Social Support  ADL's:  Intact  Cognition:  WNL  Sleep:  Number of Hours: 7  COGNITIVE FEATURES THAT CONTRIBUTE TO RISK:  None    SUICIDE RISK:   Mild:  Suicidal ideation of limited frequency, intensity, duration, and specificity.  There are no identifiable plans, no associated  intent, mild dysphoria and related symptoms, good self-control (both objective and subjective assessment), few other risk factors, and identifiable protective factors, including available and accessible social support.  PLAN OF CARE: Patient admitted to psychiatric ward.  15-minute checks in place.  Engage patient in full treatment team assessment.  Engage in individual and group counseling.  Proceed with medication management.  Reassess suicidality prior to discharge  I certify that inpatient services furnished can reasonably be expected to improve the patient's condition.   Alethia Berthold, MD 11/12/2018, 3:49 PM

## 2018-11-12 NOTE — Progress Notes (Signed)
Nutrition Brief Note  Patient identified on the Malnutrition Screening Tool (MST) Report  23 y/o female with MDD admitted with depression and SI  Wt Readings from Last 15 Encounters:  11/11/18 83.5 kg  03/16/18 93 kg  03/15/18 93 kg  01/28/12 63.5 kg (80 %, Z= 0.83)*  01/14/12 62.1 kg (77 %, Z= 0.73)*  12/25/11 62.1 kg (77 %, Z= 0.73)*  04/14/11 43.5 kg (9 %, Z= -1.34)*   * Growth percentiles are based on CDC (Girls, 2-20 Years) data.    Body mass index is 30.62 kg/m. Patient meets criteria for obesity based on current BMI. Per chart, pt down 22lbs(10%) over the past 8 months; RD unsure how recently weight loss occurred.   Pt currently on regular diet  Labs and medications reviewed.   RD will add MVI daily   No further nutrition interventions warranted at this time. If nutrition issues arise, please consult RD.   Koleen Distance MS, RD, LDN Pager #- [phone] Office#- [phone] After Hours Pager: [phone]
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
## 2018-11-12 NOTE — Progress Notes (Addendum)
Recreation Therapy Notes  Date: 11/12/2018  Time: 9:30 am  Location: Outside Behavioral response: Appropriate   Intervention Topic: Relaxation   Discussion/Intervention:  Group content today was focused on relaxation. The group defined relaxation and identified healthy ways to relax. Individuals expressed how much time they spend relaxing. Patients expressed how much their life would be if they did not make time for themselves to relax. The group stated ways they could improve their relaxation techniques in the future.  Individuals participated in the intervention "Time to Relax" where they had a chance to experience different relaxation techniques.  Clinical Observations/Feedback:  Patient came to group and identified reading and watching television as what relaxes her. She explained that relaxation takes away stress. Participant stated she normally relaxes all day because she does not work.  Individual was social with peers and staff while participating in the intervention.  Adeel Guiffre LRT/CTRS         Kell Ferris 11/12/2018 11:10 AM
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
## 2018-11-12 NOTE — BHH Group Notes (Signed)
LCSW Group Therapy Note  11/12/2018 11:14 AM  Type of Therapy/Topic:  Group Therapy:  Balance in Life  Participation Level:  Active  Description of Group:    This group will address the concept of balance and how it feels and looks when one is unbalanced. Patients will be encouraged to process areas in their lives that are out of balance and identify reasons for remaining unbalanced. Facilitators will guide patients in utilizing problem-solving interventions to address and correct the stressor making their life unbalanced. Understanding and applying boundaries will be explored and addressed for obtaining and maintaining a balanced life. Patients will be encouraged to explore ways to assertively make their unbalanced needs known to significant others in their lives, using other group members and facilitator for support and feedback.  Therapeutic Goals: 1. Patient will identify two or more emotions or situations they have that consume much of in their lives. 2. Patient will identify signs/triggers that life has become out of balance:  3. Patient will identify two ways to set boundaries in order to achieve balance in their lives:  4. Patient will demonstrate ability to communicate their needs through discussion and/or role plays  Summary of Patient Progress: Pt was late to group, but when she came she was active. Pt was respectful to other group members and discussed what would be different in her life if her life was balanced. Pt discussed being able to go back to school and getting her medical issues figured out as part of getting her life balanced.     Therapeutic Modalities:   Cognitive Behavioral Therapy Solution-Focused Therapy Assertiveness Training  Evalina Field, MSW, LCSW Clinical Social Work 11/12/2018 11:14 AM

## 2018-11-12 NOTE — Progress Notes (Signed)
Recreation Therapy Notes  INPATIENT RECREATION THERAPY ASSESSMENT  Patient Details Name: Carla Braun MRN: [id number] DOB: 05-Nov-1995 Today's Date: 11/12/2018       Information Obtained From: Patient  Able to Participate in Assessment/Interview: Yes  Patient Presentation: Responsive  Reason for Admission (Per Patient): Active Symptoms  Patient Stressors: Family, Relationship, Work  Coping Skills:   Child psychotherapist, Surveyor, mining, Art  Leisure Interests (2+):  Individual - Reading, Games - Video games, Individual - TV(Pet animals)  Frequency of Recreation/Participation: Monthly  Awareness of Community Resources:     [employer]:     Current Use:    If no, Barriers?:    Expressed Interest in [place] of Residence:  Lobbyist  Patient Main Form of Transportation: Musician  Patient Strengths:  Caring, helping others  Patient Identified Areas of Improvement:  Focus on mental health  Patient Goal for Hospitalization:  To get the right treatment and get follow up.  Current SI (including self-harm):  No  Current HI:  No  Current AVH: No  Staff Intervention Plan: Group Attendance, Collaborate with Interdisciplinary Treatment Team  Consent to Intern Participation: N/A  Chela Sutphen 11/12/2018, 2:56 PM

## 2018-11-12 NOTE — BHH Suicide Risk Assessment (Signed)
[place] INPATIENT:  Family/Significant Other Suicide Prevention Education  Suicide Prevention Education:  Patient Refusal for Family/Significant Other Suicide Prevention Education: The patient Carla Braun has refused to provide written consent for family/significant other to be provided Family/Significant Other Suicide Prevention Education during admission and/or prior to discharge.  Physician notified.  SPE completed with pt, as pt refused to consent to family contact. SPI pamphlet provided to pt and pt was encouraged to share information with support network, ask questions, and talk about any concerns relating to SPE. Pt denies access to guns/firearms and verbalized understanding of information provided. Mobile Crisis information also provided to pt.   [place] MSW LCSW 11/12/2018, 10:22 AM

## 2018-11-13 MED ORDER — FLUOXETINE HCL 20 MG PO CAPS: 20.0000 mg | ORAL_CAPSULE | Freq: Every day | ORAL | 0 refills | Status: DC

## 2018-11-13 MED ORDER — HYDROXYZINE HCL 25 MG PO TABS: 25.0000 mg | ORAL_TABLET | Freq: Three times a day (TID) | ORAL | 0 refills | Status: DC | PRN

## 2018-11-13 MED ORDER — TRAZODONE HCL 50 MG PO TABS: 50.0000 mg | ORAL_TABLET | Freq: Every evening | ORAL | 0 refills | Status: DC | PRN

## 2018-11-13 MED ORDER — CARBAMAZEPINE 200 MG PO TABS: 200.0000 mg | ORAL_TABLET | Freq: Two times a day (BID) | ORAL | 0 refills | Status: DC

## 2018-11-13 NOTE — Progress Notes (Signed)
[facility] MD Progress Note  11/13/2018 3:35 PM Carla Braun  MRN:  [id number] Subjective: Follow-up for this patient with bipolar disorder chronic anxiety and depression.  Patient says she is feeling a little bit better.  She has been attending groups all day and socializing appropriately.  She says that her mood is feeling a little bit more stable.  She denies any current suicidal or homicidal thoughts.  Denies psychotic symptoms.  Tolerating medicines without any complaints of any side effects.  Attended treatment team today and was appropriate.  Has not been acting out to harm her self any further.  Labs look fine vital signs fine Principal Problem: Bipolar 2 disorder (HCC) Diagnosis: Principal Problem:   Bipolar 2 disorder ([place]) Active Problems:   MDD (major depressive disorder), severe (HCC)   Self-inflicted laceration of wrist   Borderline personality disorder ([place])  Total Time spent with patient: 30 minutes  Past Psychiatric History: Patient has a history of chronic mood instability bipolar disorder versus borderline self injury recent worsening with some cutting  Past Medical History:  Past Medical History:  Diagnosis Date  . Abdominal pain   . Inflammation of small intestine    per CT   History reviewed. No pertinent surgical history. Family History: History reviewed. No pertinent family history. Family Psychiatric  History: See previous Social History:  Social History   Substance and Sexual Activity  Alcohol Use No     Social History   Substance and Sexual Activity  Drug Use No    Social History   Socioeconomic History  . Marital status: Single    Spouse name: Not on file  . Number of children: Not on file  . Years of education: Not on file  . Highest education level: Not on file  Occupational History  . Not on file  Social Needs  . Financial resource strain: Not on file  . Food insecurity    Worry: Not on file    Inability: Not on file  . Transportation  needs    Medical: Not on file    Non-medical: Not on file  Tobacco Use  . Smoking status: Current Every Day Smoker    Packs/day: 0.50    Types: Cigarettes  . Smokeless tobacco: Never Used  Substance and Sexual Activity  . Alcohol use: No  . Drug use: No  . Sexual activity: Yes    Birth control/protection: I.U.D.  Lifestyle  . Physical activity    Days per week: Not on file    Minutes per session: Not on file  . Stress: Not on file  Relationships  . Social Herbalist on phone: Not on file    Gets together: Not on file    Attends religious service: Not on file    Active member of club or organization: Not on file    Attends meetings of clubs or organizations: Not on file    Relationship status: Not on file  Other Topics Concern  . Not on file  Social History Narrative   12th grade completed   Additional Social History:                         Sleep: Fair  Appetite:  Fair  Current Medications: Current Facility-Administered Medications  Medication Dose Route Frequency Provider Last Rate Last Dose  . acetaminophen (TYLENOL) tablet 650 mg  650 mg Oral Q6H PRN Money, Lowry Ram, FNP   650 mg at 11/11/18  2021  . alum & mag hydroxide-simeth (MAALOX/MYLANTA) 200-200-20 MG/5ML suspension 30 mL  30 mL Oral Q4H PRN Money, Feliz Beamravis B, FNP      . carbamazepine (TEGRETOL) tablet 200 mg  200 mg Oral BID Sumaiyah Markert, Jackquline DenmarkJohn T, MD   200 mg at 11/13/18 0810  . FLUoxetine (PROZAC) capsule 20 mg  20 mg Oral Daily Harvin Konicek T, MD   20 mg at 11/13/18 [id number]  . hydrOXYzine (ATARAX/VISTARIL) tablet 25 mg  25 mg Oral TID PRN Petar Mucci, Jackquline DenmarkJohn T, MD   25 mg at 11/11/18 2021  . ibuprofen (ADVIL) tablet 600 mg  600 mg Oral Q6H PRN Gissele Narducci, Jackquline DenmarkJohn T, MD   600 mg at 11/12/18 1659  . magnesium hydroxide (MILK OF MAGNESIA) suspension 30 mL  30 mL Oral Daily PRN Money, Gerlene Burdockravis B, FNP      . multivitamin with minerals tablet 1 tablet  1 tablet Oral Daily Terry Bolotin, Jackquline DenmarkJohn T, MD   1 tablet at 11/13/18  0810  . nicotine (NICODERM CQ - dosed in mg/24 hours) patch 21 mg  21 mg Transdermal Daily Kamare Caspers, Jackquline DenmarkJohn T, MD   21 mg at 11/13/18 0814  . traZODone (DESYREL) tablet 50 mg  50 mg Oral QHS PRN Money, Gerlene Burdockravis B, FNP   50 mg at 11/12/18 2130    Lab Results: No results found for this or any previous visit (from the past 48 hour(s)).  Blood Alcohol level:  Lab Results  Component Value Date   ETH <10 03/15/2018    Metabolic Disorder Labs: Lab Results  Component Value Date   HGBA1C 4.9 03/16/2018   MPG 93.93 03/16/2018   MPG 103 04/14/2011   No results found for: PROLACTIN Lab Results  Component Value Date   CHOL 96 03/16/2018   TRIG 103 03/16/2018   HDL 36 (L) 03/16/2018   CHOLHDL 2.7 03/16/2018   VLDL 21 03/16/2018   LDLCALC 39 03/16/2018   LDLCALC 29 04/14/2011    Physical Findings: AIMS:  , ,  ,  ,    CIWA:    COWS:     Musculoskeletal: Strength & Muscle Tone: within normal limits Gait & Station: normal Patient leans: N/A  Psychiatric Specialty Exam: Physical Exam  Nursing note and vitals reviewed. Constitutional: She appears well-developed and well-nourished.  HENT:  Head: Normocephalic and atraumatic.  Eyes: Pupils are equal, round, and reactive to light. Conjunctivae are normal.  Neck: Normal range of motion.  Cardiovascular: Regular rhythm and normal heart sounds.  Respiratory: Effort normal. No respiratory distress.  GI: Soft.  Musculoskeletal: Normal range of motion.  Neurological: She is alert.  Skin: Skin is warm and dry.  Psychiatric: She has a normal mood and affect. Her speech is normal and behavior is normal. Judgment and thought content normal. Cognition and memory are normal.    Review of Systems  Constitutional: Negative.   HENT: Negative.   Eyes: Negative.   Respiratory: Negative.   Cardiovascular: Negative.   Gastrointestinal: Negative.   Musculoskeletal: Negative.   Skin: Negative.   Neurological: Negative.   Psychiatric/Behavioral:  Positive for depression. Negative for hallucinations, memory loss, substance abuse and suicidal ideas. The patient is not nervous/anxious and does not have insomnia.     Blood pressure 120/77, pulse 87, temperature 98.6 F (37 C), temperature source Oral, resp. rate 18, height 5\' 5"  (1.651 m), weight 83.5 kg, SpO2 100 %.Body mass index is 30.62 kg/m.  General Appearance: Casual  Eye Contact:  Good  Speech:  Clear and Coherent  Volume:  Normal  Mood:  Euthymic  Affect:  Constricted  Thought Process:  Goal Directed  Orientation:  Full (Time, Place, and Person)  Thought Content:  Logical  Suicidal Thoughts:  No  Homicidal Thoughts:  No  Memory:  Immediate;   Fair Recent;   Fair Remote;   Fair  Judgement:  Fair  Insight:  Fair  Psychomotor Activity:  Normal  Concentration:  Concentration: Fair  Recall:  [employer]Fair  Fund of Knowledge:  Fair  Language:  Fair  Akathisia:  No  Handed:  Right  AIMS (if indicated):     Assets:  Desire for Improvement Housing Physical Health Resilience  ADL's:  Intact  Cognition:  WNL  Sleep:  Number of Hours: 6.75     Treatment Plan Summary: Daily contact with patient to assess and evaluate symptoms and progress in treatment, Medication management and Plan Patient stabilizing cooperative with with appropriate treatment.  No problem behaviors.  Reviewed medications.  We will start making preparations for likely discharge on Monday.  Mordecai RasmussenJohn Chancellor Vanderloop, MD 11/13/2018, 3:35 PM

## 2018-11-13 NOTE — Plan of Care (Signed)
  Problem: Medication: Goal: Compliance with prescribed medication regimen will improve Outcome: Progressing  Patient complaint with prescribed regimen.

## 2018-11-13 NOTE — BHH Group Notes (Signed)
[place] Group Notes:  (Nursing/MHT/Case Management/Adjunct)  Date:  11/13/2018  Time:  4:47 AM  Type of Therapy:  Group Therapy  Participation Level:  Active  Participation Quality:  Appropriate  Affect:  Appropriate  Cognitive:  Alert  Insight:  Good  Engagement in Group:  Engaged  Modes of Intervention:  Support  Summary of Progress/Problems:  Carla Braun 11/13/2018, 4:47 AM

## 2018-11-13 NOTE — BHH Group Notes (Signed)
LCSW Group Therapy Note  11/13/2018 1:00 PM  Type of Therapy and Topic:  Group Therapy:  Feelings around Relapse and Recovery  Participation Level:  Did Not Attend   Description of Group:    Patients in this group will discuss emotions they experience before and after a relapse. They will process how experiencing these feelings, or avoidance of experiencing them, relates to having a relapse. Facilitator will guide patients to explore emotions they have related to recovery. Patients will be encouraged to process which emotions are more powerful. They will be guided to discuss the emotional reaction significant others in their lives may have to their relapse or recovery. Patients will be assisted in exploring ways to respond to the emotions of others without this contributing to a relapse.  Therapeutic Goals: 1. Patient will identify two or more emotions that lead to a relapse for them 2. Patient will identify two emotions that result when they relapse 3. Patient will identify two emotions related to recovery 4. Patient will demonstrate ability to communicate their needs through discussion and/or role plays   Summary of Patient Progress: X  Therapeutic Modalities:   Cognitive Behavioral Therapy Solution-Focused Therapy Assertiveness Training Relapse Prevention Therapy   Somer Trotter, MSW, LCSW 11/13/2018 12:26 PM  

## 2018-11-13 NOTE — Tx Team (Addendum)
Interdisciplinary Treatment and Diagnostic Plan Update  11/13/2018 Time of Session: 230p Carla Braun MRN: [id number]  Principal Diagnosis: Bipolar 2 disorder [facility])  Secondary Diagnoses: Principal Problem:   Bipolar 2 disorder ([place]) Active Problems:   MDD (major depressive disorder), severe ([place])   Self-inflicted laceration of wrist   Borderline personality disorder ([place])   Current Medications:  Current Facility-Administered Medications  Medication Dose Route Frequency Provider Last Rate Last Dose  . acetaminophen (TYLENOL) tablet 650 mg  650 mg Oral Q6H PRN Money, Lowry Ram, FNP   650 mg at 11/11/18 2021  . alum & mag hydroxide-simeth (MAALOX/MYLANTA) 200-200-20 MG/5ML suspension 30 mL  30 mL Oral Q4H PRN Money, Lowry Ram, FNP      . carbamazepine (TEGRETOL) tablet 200 mg  200 mg Oral BID Clapacs, Madie Reno, MD   200 mg at 11/13/18 0810  . FLUoxetine (PROZAC) capsule 20 mg  20 mg Oral Daily Clapacs, John T, MD   20 mg at 11/13/18 2993  . hydrOXYzine (ATARAX/VISTARIL) tablet 25 mg  25 mg Oral TID PRN Clapacs, Madie Reno, MD   25 mg at 11/11/18 2021  . ibuprofen (ADVIL) tablet 600 mg  600 mg Oral Q6H PRN Clapacs, Madie Reno, MD   600 mg at 11/12/18 1659  . magnesium hydroxide (MILK OF MAGNESIA) suspension 30 mL  30 mL Oral Daily PRN Money, Lowry Ram, FNP      . multivitamin with minerals tablet 1 tablet  1 tablet Oral Daily Clapacs, Madie Reno, MD   1 tablet at 11/13/18 0810  . nicotine (NICODERM CQ - dosed in mg/24 hours) patch 21 mg  21 mg Transdermal Daily Clapacs, Madie Reno, MD   21 mg at 11/13/18 0814  . traZODone (DESYREL) tablet 50 mg  50 mg Oral QHS PRN Money, Lowry Ram, FNP   50 mg at 11/12/18 2130   PTA Medications: Medications Prior to Admission  Medication Sig Dispense Refill Last Dose  . carbamazepine (TEGRETOL) 100 MG chewable tablet Chew 1 tablet (100 mg total) by mouth 3 (three) times daily. 90 tablet 2   . ergocalciferol (VITAMIN D2) 1.25 MG (50000 UT) capsule Take 50,000 Units by mouth  once a week.      . Fiber CHEW Chew 1 each by mouth daily. 100 tablet 0   . levonorgestrel (MIRENA) 20 MCG/24HR IUD 1 each by Intrauterine route once.     . metFORMIN (GLUCOPHAGE) 500 MG tablet Take 500 mg by mouth daily with breakfast.      . vortioxetine HBr (TRINTELLIX) 5 MG TABS tablet Take 1 tablet (5 mg total) by mouth daily. 30 tablet 2     Patient Stressors: Financial difficulties Health problems Substance abuse  Patient Strengths: Ability for insight Active sense of humor Average or above average intelligence Capable of independent living Communication skills Supportive family/friends  Treatment Modalities: Medication Management, Group therapy, Case management,  1 to 1 session with clinician, Psychoeducation, Recreational therapy.   Physician Treatment Plan for Primary Diagnosis: Bipolar 2 disorder ([place]) Long Term Goal(s): Improvement in symptoms so as ready for discharge Improvement in symptoms so as ready for discharge   Short Term Goals: Ability to verbalize feelings will improve Ability to disclose and discuss suicidal ideas Ability to identify and develop effective coping behaviors will improve Ability to maintain clinical measurements within normal limits will improve Compliance with prescribed medications will improve  Medication Management: Evaluate patient's response, side effects, and tolerance of medication regimen.  Therapeutic Interventions: 1 to 1  sessions, Unit Group sessions and Medication administration.  Evaluation of Outcomes: Progressing  Physician Treatment Plan for Secondary Diagnosis: Principal Problem:   Bipolar 2 disorder ([facility]) Active Problems:   MDD (major depressive disorder), severe ([facility])   Self-inflicted laceration of wrist   Borderline personality disorder (H[facility])  Long Term Goal(s): Improvement in symptoms so as ready for discharge Improvement in symptoms so as ready for discharge   Short Term Goals: Ability to verbalize feelings  will improve Ability to disclose and discuss suicidal ideas Ability to identify and develop effective coping behaviors will improve Ability to maintain clinical measurements within normal limits will improve Compliance with prescribed medications will improve     Medication Management: Evaluate patient's response, side effects, and tolerance of medication regimen.  Therapeutic Interventions: 1 to 1 sessions, Unit Group sessions and Medication administration.  Evaluation of Outcomes: Progressing   RN Treatment Plan for Primary Diagnosis: Bipolar 2 disorder (H[facility]) Long Term Goal(s): Knowledge of disease and therapeutic regimen to maintain health will improve  Short Term Goals: Ability to participate in decision making will improve, Ability to verbalize feelings will improve, Ability to disclose and discuss suicidal ideas, Ability to identify and develop effective coping behaviors will improve and Compliance with prescribed medications will improve  Medication Management: RN will administer medications as ordered by provider, will assess and evaluate patient's response and provide education to patient for prescribed medication. RN will report any adverse and/or side effects to prescribing provider.  Therapeutic Interventions: 1 on 1 counseling sessions, Psychoeducation, Medication administration, Evaluate responses to treatment, Monitor vital signs and CBGs as ordered, Perform/monitor CIWA, COWS, AIMS and Fall Risk screenings as ordered, Perform wound care treatments as ordered.  Evaluation of Outcomes: Progressing   LCSW Treatment Plan for Primary Diagnosis: Bipolar 2 disorder (HCC) Long Term Goal(s): Safe transition to appropriate next level of care at discharge, Engage patient in therapeutic group addressing interpersonal concerns.  Short Term Goals: Engage patient in aftercare planning with referrals and resources  Therapeutic Interventions: Assess for all discharge needs, 1 to 1 time  with Social worker, Explore available resources and support systems, Assess for adequacy in community support network, Educate family and significant other(s) on suicide prevention, Complete Psychosocial Assessment, Interpersonal group therapy.  Evaluation of Outcomes: Progressing   Progress in Treatment: Attending groups: No. Participating in groups: No. Taking medication as prescribed: Yes. Toleration medication: Yes. Family/Significant other contact made: Yes, individual(s) contacted:  with pt; declined family contact Patient understands diagnosis: Yes. Discussing patient identified problems/goals with staff: Yes. Medical problems stabilized or resolved: No. Denies suicidal/homicidal ideation: Yes. Issues/concerns per patient self-inventory: No. Other: NA  New problem(s) identified: No, Describe:  none reported  New Short Term/Long Term Goal(s):Attend outpatient treatment, take medication as prescribed, develop and implement healthy coping methods  Patient Goals:  "Focus on the positive and not the negative"  Discharge Plan or Barriers: Pt will return home and [facility] [place]  Reason for Continuation of Hospitalization: Medication stabilization Suicidal ideation  Estimated Length of Stay:3-5 days  Recreational Therapy: Patient: Relationship  Patient Goal: Patient will engage in groups without prompting or encouragement from LRT x3 group sessions within 5 recreation therapy group sessions  Attendees: Patient:Chandy Runell GessWaugh 11/13/2018 3:09 PM  Physician: Mordecai RasmussenJohn Clapacs 11/13/2018 3:09 PM  Nursing:  11/13/2018 3:09 PM  RN Care Manager: 11/13/2018 3:09 PM  Social Worker: Lowella Dandyarren Livingston 11/13/2018 3:09 PM  Recreational Therapist:  11/13/2018 3:09 PM  Other:  11/13/2018 3:09 PM  Other:  11/13/2018 3:09 PM  Other: 11/13/2018 3:09 PM    Scribe for Treatment Team: Suzan SlickARREN T LIVINGSTON, LCSW 11/13/2018 3:09 PM

## 2018-11-13 NOTE — Progress Notes (Signed)
Recreation Therapy Notes    Date: 11/13/2018  Time: 9:30 am  Location: Craft room  Behavioral response: Appropriate   Intervention Topic: Leisure   Discussion/Intervention:  Group content today was focused on leisure. The group defined what leisure is and some positive leisure activities they participate in. Individuals identified the difference between good and bad leisure. Participants expressed how they feel after participating in the leisure of their choice. The group discussed how they go about picking a leisure activity and if others are involved in their leisure activities. The patient stated how many leisure activities they too choose from and reasons why it is important to have leisure time. Individuals participated in the intervention "Exploration of Leisure" where they had a chance to identify new leisure activities as well as benefits of leisure. Clinical Observations/Feedback:  Patient came to group and identified her favorite show as pretty little liars. She explained that she likes to play tennis as an outside activity. Individual was social with peers and staff while participating in the intervention.  Hearl Heikes LRT/CTRS         Amaziah Ghosh 11/13/2018 11:26 AM

## 2018-11-13 NOTE — Plan of Care (Signed)
Pt denies depression, anxiety, SI, HI and AVH. Pt was educated on care plan and verbalizes understanding. Collier Bullock RN Problem: Education: Goal: Knowledge of [place] [facility] Education information/materials will improve Outcome: Progressing   Problem: Coping: Goal: Ability to verbalize frustrations and anger appropriately will improve Outcome: Progressing Goal: Ability to demonstrate self-control will improve Outcome: Progressing   Problem: Safety: Goal: Periods of time without injury will increase Outcome: Progressing   Problem: Education: Goal: Utilization of techniques to improve thought processes will improve Outcome: Progressing Goal: Knowledge of the prescribed therapeutic regimen will improve Outcome: Progressing   Problem: Medication: Goal: Compliance with prescribed medication regimen will improve Outcome: Progressing   Problem: Self-Concept: Goal: Ability to disclose and discuss suicidal ideas will improve Outcome: Progressing Goal: Will verbalize positive feelings about self Outcome: Progressing

## 2018-11-13 NOTE — Progress Notes (Signed)
Patient alert and oriented x 4, affect is blunted, she appears less anxious, interacting appropriately with peers and staff, denies SI/HI/AVH no distress noted, rated depression  a 5/10 ( 0 low - 10 high ) interacting appropriately with peers and staff , complaint with medication regimen. 15 minutes safety checks maintained will continue to monitor.

## 2018-11-14 NOTE — Plan of Care (Signed)
  Problem: Coping: Goal: Ability to verbalize frustrations and anger appropriately will improve Outcome: Progressing Goal: Ability to demonstrate self-control will improve Outcome: Progressing  D: Patient has been pleasant and cooperative. Denies SI, HI and AVH. Mood is pleasant. Affect is appropriate to circumstance. A: Continue to monitor for safety R: Safety maintained.

## 2018-11-14 NOTE — Plan of Care (Signed)
Cooperative and compliant with with treatment. Visible and active in the milieu.  Pleasant and cooperative

## 2018-11-14 NOTE — Progress Notes (Signed)
D: Patient has been pleasant and cooperative. Denies SI, HI and AVH. Mood is pleasant. Affect is appropriate to circumstance. A: Continue to monitor for safety R: Safety maintained.

## 2018-11-14 NOTE — B[facility] Group Notes (Signed)
[facility] LCSW Group Therapy Note    Date/Time: 11/14/2018 12:50PM   Type of Therapy and Topic: Group Therapy: Trust and Honesty    Participation Level:  Active   Description of Group:  In this group patients will be asked to explore value of being honest. Patients will be guided to discuss their thoughts, feelings, and behaviors related to honesty and trusting in others. Patients will process together how trust and honesty relate to how we form relationships with peers, family members, and self. Each patient will be challenged to identify and express feelings of being vulnerable. Patients will discuss reasons why people are dishonest and identify alternative outcomes if one was truthful (to self or others). This group will be process-oriented, with patients participating in exploration of their own experiences as well as giving and receiving support and challenge from other group members.    Therapeutic Goals:  1. Patient will identify why honesty is important to relationships and how honesty overall affects relationships.  2. Patient will identify a situation where they lied or were lied too and the feelings, thought process, and behaviors surrounding the situation  3. Patient will identify the meaning of being vulnerable, how that feels, and how that correlates to being honest with self and others.  4. Patient will identify situations where they could have told the truth, but instead lied and explain reasons of dishonesty.    Summary of Patient Progress  Group members engaged in discussion on trust and honesty. Group members shared times where they have been dishonest or people have broken their trust and how the relationship was effected. Group members shared why people break trust, and the importance of trust in a relationship. Each group member shared a person in their life that they can trust. Patient participated in group discussion. She identified those who are part of her support system who she  can trust and will be able to be honest with about her thoughts and feelings after she discharges. She identified her family as those who tear her down verbally but who are also part of her support system. She discussed how this dual relationship makes her feel. She identified changes she wants to make in the relationship after she discharges.    Therapeutic Modalities:  Cognitive Behavioral Therapy  Solution Focused Therapy  Motivational Interviewing  Brief Therapy      Netta Neat, MSW, LCSW Clinical Social Work

## 2018-11-14 NOTE — Progress Notes (Signed)
[facility] MD Progress Note  11/14/2018 10:22 AM Carla Braun  MRN:  [id number] Subjective: Patient is a 23 year old female with a history of mood instability who was transferred from the [facility] on 11/12/2018 because of a "mental breakdown".  Objective: Patient is seen and examined.  Patient is a 23 year old female with a probable past psychiatric history significant for bipolar disorder type II.  She is seen in follow-up.  She stated she is feeling better.  She denied any suicidal ideation.  She continues on Tegretol, fluoxetine and trazodone.  She stated her mood symptoms were improving.  Review of her note from 8/21 was essentially unchanged from the description above.  Her vital signs are stable, she is afebrile.  She slept 5 hours last p.m.  No new laboratories since she was transferred.  Principal Problem: Bipolar 2 disorder ([place]) Diagnosis: Principal Problem:   Bipolar 2 disorder ([place]) Active Problems:   MDD (major depressive disorder), severe ([facility])   Self-inflicted laceration of wrist   Borderline personality disorder ([place])  Total Time spent with patient: 20 minutes  Past Psychiatric History: See admission H&P  Past Medical History:  Past Medical History:  Diagnosis Date  . Abdominal pain   . Inflammation of small intestine    per CT   History reviewed. No pertinent surgical history. Family History: History reviewed. No pertinent family history. Family Psychiatric  History: See admission H&P Social History:  Social History   Substance and Sexual Activity  Alcohol Use No     Social History   Substance and Sexual Activity  Drug Use No    Social History   Socioeconomic History  . Marital status: Single    Spouse name: Not on file  . Number of children: Not on file  . Years of education: Not on file  . Highest education level: Not on file  Occupational History  . Not on file  Social Needs  . Financial resource strain: Not on file  . Food insecurity     Worry: Not on file    Inability: Not on file  . Transportation needs    Medical: Not on file    Non-medical: Not on file  Tobacco Use  . Smoking status: Current Every Day Smoker    Packs/day: 0.50    Types: Cigarettes  . Smokeless tobacco: Never Used  Substance and Sexual Activity  . Alcohol use: No  . Drug use: No  . Sexual activity: Yes    Birth control/protection: I.U.D.  Lifestyle  . Physical activity    Days per week: Not on file    Minutes per session: Not on file  . Stress: Not on file  Relationships  . Social Herbalist on phone: Not on file    Gets together: Not on file    Attends religious service: Not on file    Active member of club or organization: Not on file    Attends meetings of clubs or organizations: Not on file    Relationship status: Not on file  Other Topics Concern  . Not on file  Social History Narrative   12th grade completed   Additional Social History:                         Sleep: Fair  Appetite:  Fair  Current Medications: Current Facility-Administered Medications  Medication Dose Route Frequency Provider Last Rate Last Dose  . acetaminophen (TYLENOL) tablet 650 mg  650  mg Oral Q6H PRN Money, Gerlene Burdockravis B, FNP   650 mg at 11/11/18 2021  . alum & mag hydroxide-simeth (MAALOX/MYLANTA) 200-200-20 MG/5ML suspension 30 mL  30 mL Oral Q4H PRN Money, Gerlene Burdockravis B, FNP      . carbamazepine (TEGRETOL) tablet 200 mg  200 mg Oral BID Clapacs, Jackquline DenmarkJohn T, MD   200 mg at 11/14/18 [id number]  . FLUoxetine (PROZAC) capsule 20 mg  20 mg Oral Daily Clapacs, John T, MD   20 mg at 11/14/18 [id number]  . hydrOXYzine (ATARAX/VISTARIL) tablet 25 mg  25 mg Oral TID PRN Clapacs, Jackquline DenmarkJohn T, MD   25 mg at 11/11/18 2021  . ibuprofen (ADVIL) tablet 600 mg  600 mg Oral Q6H PRN Clapacs, Jackquline DenmarkJohn T, MD   600 mg at 11/14/18 0826  . magnesium hydroxide (MILK OF MAGNESIA) suspension 30 mL  30 mL Oral Daily PRN Money, Gerlene Burdockravis B, FNP      . multivitamin with minerals tablet 1 tablet   1 tablet Oral Daily Clapacs, Jackquline DenmarkJohn T, MD   1 tablet at 11/14/18 [phone]  . nicotine (NICODERM CQ - dosed in mg/24 hours) patch 21 mg  21 mg Transdermal Daily Clapacs, Jackquline DenmarkJohn T, MD   21 mg at 11/14/18 [id number]  . traZODone (DESYREL) tablet 50 mg  50 mg Oral QHS PRN Money, Gerlene Burdockravis B, FNP   50 mg at 11/13/18 2114    Lab Results: No results found for this or any previous visit (from the past 48 hour(s)).  Blood Alcohol level:  Lab Results  Component Value Date   ETH <10 03/15/2018    Metabolic Disorder Labs: Lab Results  Component Value Date   HGBA1C 4.9 03/16/2018   MPG 93.93 03/16/2018   MPG 103 04/14/2011   No results found for: PROLACTIN Lab Results  Component Value Date   CHOL 96 03/16/2018   TRIG 103 03/16/2018   HDL 36 (L) 03/16/2018   CHOLHDL 2.7 03/16/2018   VLDL 21 03/16/2018   LDLCALC 39 03/16/2018   LDLCALC 29 04/14/2011    Physical Findings: AIMS:  , ,  ,  ,    CIWA:    COWS:     Musculoskeletal: Strength & Muscle Tone: within normal limits Gait & Station: normal Patient leans: N/A  Psychiatric Specialty Exam: Physical Exam  Nursing note and vitals reviewed. Constitutional: She is oriented to person, place, and time. She appears well-developed and well-nourished.  HENT:  Head: Normocephalic and atraumatic.  Respiratory: Effort normal.  Neurological: She is alert and oriented to person, place, and time.    ROS  Blood pressure 136/78, pulse 81, temperature 98.2 F (36.8 C), temperature source Oral, resp. rate 18, height 5\' 5"  (1.651 m), weight 83.5 kg, SpO2 99 %.Body mass index is 30.62 kg/m.  General Appearance: Casual  Eye Contact:  Fair  Speech:  Normal Rate  Volume:  Decreased  Mood:  Depressed  Affect:  Congruent  Thought Process:  Coherent and Descriptions of Associations: Intact  Orientation:  Full (Time, Place, and Person)  Thought Content:  Logical  Suicidal Thoughts:  No  Homicidal Thoughts:  No  Memory:  Immediate;   Fair Recent;    Fair Remote;   Fair  Judgement:  Intact  Insight:  Fair  Psychomotor Activity:  Normal  Concentration:  Concentration: Fair and Attention Span: Fair  Recall:  FiservFair  Fund of Knowledge:  Good  Language:  Good  Akathisia:  Negative  Handed:  Right  AIMS (if indicated):  Assets:  Desire for Improvement Resilience  ADL's:  Intact  Cognition:  WNL  Sleep:  Number of Hours: 5     Treatment Plan Summary: Daily contact with patient to assess and evaluate symptoms and progress in treatment, Medication management and Plan : Patient is seen and examined.  Patient is a 23 year old female with the above-stated past psychiatric history who is seen in follow-up.   Diagnosis: #1 bipolar disorder type II  Patient is seen in follow-up.  She continues to slowly improve.  No change in her current medications.  She continues on Tegretol 200 mg p.o. twice daily, fluoxetine 20 mg p.o. daily.  I will order a Tegretol level, liver function enzymes and a CBC with differential for the a.m. on 8/24.  She denied any side effects to her medications, but we want to be complete. 1.  Continue Tegretol 200 mg p.o. twice daily for mood stability. 2.  Continue fluoxetine 20 mg p.o. daily for mood and anxiety. 3.  Continue trazodone 50 mg p.o. nightly as needed insomnia. 4.  Tegretol level, liver function enzymes and a CBC with differential in the a.m. on 8/24. 5.  Disposition planning-in progress.  Antonieta PertGreg Lawson Nkosi Cortright, MD 11/14/2018, 10:22 AM

## 2018-11-15 MED ORDER — TRAZODONE HCL 100 MG PO TABS
100.0000 mg | ORAL_TABLET | Freq: Every evening | ORAL | Status: DC | PRN
  Administered 2018-11-15: 100 mg via ORAL
  Filled 2018-11-15: qty 1

## 2018-11-15 NOTE — Progress Notes (Signed)
[facility] MD Progress Note  11/15/2018 9:35 AM SAMIA KUKLA  MRN:  [id number] Subjective:  Patient is a 23 year old female with a history of mood instability who was transferred from the [facility] on 11/12/2018 because of a "mental breakdown".  Objective: Patient is seen and examined.  Patient is a 23 year old female with probable past psychiatric history significant for bipolar disorder type II.  She is seen in follow-up.  She continues to do well.  She denied any suicidal ideation.  No racing thoughts, no pressured speech.  No irritability.  She continues on Tegretol, fluoxetine and trazodone.  Her labs with regard to the Tegretol is been ordered for tomorrow morning.  Her vital signs are stable, she is afebrile.  She slept 5 hours last night.  Principal Problem: Bipolar 2 disorder ([place]) Diagnosis: Principal Problem:   Bipolar 2 disorder ([place]) Active Problems:   MDD (major depressive disorder), severe (HCC)   Self-inflicted laceration of wrist   Borderline personality disorder ([place])  Total Time spent with patient: 20 minutes  Past Psychiatric History: See admission H&P  Past Medical History:  Past Medical History:  Diagnosis Date  . Abdominal pain   . Inflammation of small intestine    per CT   History reviewed. No pertinent surgical history. Family History: History reviewed. No pertinent family history. Family Psychiatric  History: See admission H&P Social History:  Social History   Substance and Sexual Activity  Alcohol Use No     Social History   Substance and Sexual Activity  Drug Use No    Social History   Socioeconomic History  . Marital status: Single    Spouse name: Not on file  . Number of children: Not on file  . Years of education: Not on file  . Highest education level: Not on file  Occupational History  . Not on file  Social Needs  . Financial resource strain: Not on file  . Food insecurity    Worry: Not on file    Inability: Not on file  .  Transportation needs    Medical: Not on file    Non-medical: Not on file  Tobacco Use  . Smoking status: Current Every Day Smoker    Packs/day: 0.50    Types: Cigarettes  . Smokeless tobacco: Never Used  Substance and Sexual Activity  . Alcohol use: No  . Drug use: No  . Sexual activity: Yes    Birth control/protection: I.U.D.  Lifestyle  . Physical activity    Days per week: Not on file    Minutes per session: Not on file  . Stress: Not on file  Relationships  . Social Herbalist on phone: Not on file    Gets together: Not on file    Attends religious service: Not on file    Active member of club or organization: Not on file    Attends meetings of clubs or organizations: Not on file    Relationship status: Not on file  Other Topics Concern  . Not on file  Social History Narrative   12th grade completed   Additional Social History:                         Sleep: Fair  Appetite:  Fair  Current Medications: Current Facility-Administered Medications  Medication Dose Route Frequency Provider Last Rate Last Dose  . acetaminophen (TYLENOL) tablet 650 mg  650 mg Oral Q6H PRN Money, Lowry Ram,  FNP   650 mg at 11/14/18 0826  . alum & mag hydroxide-simeth (MAALOX/MYLANTA) 200-200-20 MG/5ML suspension 30 mL  30 mL Oral Q4H PRN Money, Gerlene Burdockravis B, FNP      . carbamazepine (TEGRETOL) tablet 200 mg  200 mg Oral BID Clapacs, Jackquline DenmarkJohn T, MD   200 mg at 11/15/18 0813  . FLUoxetine (PROZAC) capsule 20 mg  20 mg Oral Daily Clapacs, John T, MD   20 mg at 11/15/18 0813  . hydrOXYzine (ATARAX/VISTARIL) tablet 25 mg  25 mg Oral TID PRN Clapacs, Jackquline DenmarkJohn T, MD   25 mg at 11/14/18 2024  . ibuprofen (ADVIL) tablet 600 mg  600 mg Oral Q6H PRN Clapacs, Jackquline DenmarkJohn T, MD   600 mg at 11/14/18 1808  . magnesium hydroxide (MILK OF MAGNESIA) suspension 30 mL  30 mL Oral Daily PRN Money, Gerlene Burdockravis B, FNP      . multivitamin with minerals tablet 1 tablet  1 tablet Oral Daily Clapacs, Jackquline DenmarkJohn T, MD   1 tablet  at 11/15/18 0813  . nicotine (NICODERM CQ - dosed in mg/24 hours) patch 21 mg  21 mg Transdermal Daily Clapacs, Jackquline DenmarkJohn T, MD   21 mg at 11/15/18 [id number]  . traZODone (DESYREL) tablet 50 mg  50 mg Oral QHS PRN Money, Gerlene Burdockravis B, FNP   50 mg at 11/14/18 2229    Lab Results: No results found for this or any previous visit (from the past 48 hour(s)).  Blood Alcohol level:  Lab Results  Component Value Date   ETH <10 03/15/2018    Metabolic Disorder Labs: Lab Results  Component Value Date   HGBA1C 4.9 03/16/2018   MPG 93.93 03/16/2018   MPG 103 04/14/2011   No results found for: PROLACTIN Lab Results  Component Value Date   CHOL 96 03/16/2018   TRIG 103 03/16/2018   HDL 36 (L) 03/16/2018   CHOLHDL 2.7 03/16/2018   VLDL 21 03/16/2018   LDLCALC 39 03/16/2018   LDLCALC 29 04/14/2011    Physical Findings: AIMS:  , ,  ,  ,    CIWA:    COWS:     Musculoskeletal: Strength & Muscle Tone: within normal limits Gait & Station: normal Patient leans: N/A  Psychiatric Specialty Exam: Physical Exam  Nursing note and vitals reviewed. Constitutional: She is oriented to person, place, and time. She appears well-developed and well-nourished.  HENT:  Head: Normocephalic and atraumatic.  Respiratory: Effort normal.  Neurological: She is alert and oriented to person, place, and time.    ROS  Blood pressure 119/70, pulse 79, temperature 98.1 F (36.7 C), temperature source Oral, resp. rate 18, height 5\' 5"  (1.651 m), weight 83.5 kg, SpO2 99 %.Body mass index is 30.62 kg/m.  General Appearance: Casual  Eye Contact:  Fair  Speech:  Normal Rate  Volume:  Decreased  Mood:  Euthymic  Affect:  Congruent  Thought Process:  Coherent and Descriptions of Associations: Intact  Orientation:  Full (Time, Place, and Person)  Thought Content:  Logical  Suicidal Thoughts:  No  Homicidal Thoughts:  No  Memory:  Immediate;   Fair Recent;   Fair Remote;   Fair  Judgement:  Intact  Insight:  Fair   Psychomotor Activity:  Normal  Concentration:  Concentration: Fair and Attention Span: Fair  Recall:  [employer]iservFair  Fund of Knowledge:  Fair  Language:  Good  Akathisia:  Negative  Handed:  Right  AIMS (if indicated):     Assets:  Desire for Improvement Resilience  ADL's:  Intact  Cognition:  WNL  Sleep:  Number of Hours: 5     Treatment Plan Summary: Daily contact with patient to assess and evaluate symptoms and progress in treatment, Medication management and Plan  Patient is seen and examined.  Patient is a 23 year old female with the above-stated past psychiatric history who is seen in follow-up.   Diagnosis: #1 bipolar disorder type II  Patient is seen in follow-up.  She continues to do well.  She has a Tegretol level, liver function enzymes and CBC with differential ordered for tomorrow morning.  She still having some trouble sleeping.  I am going to increase her trazodone 200 mg p.o. nightly as needed.  No other changes to her medications. 1.  Continue Tegretol 200 mg p.o. twice daily for mood stability. 2.  Continue fluoxetine 20 mg p.o. daily for mood and anxiety. 3.  Increase trazodone to 100 mg p.o. nightly as needed insomnia. 4.  Tegretol level, liver function enzymes and a CBC with differential in the a.m. on 8/24. 5.  Disposition planning-in progress.  Antonieta PertGreg Lawson , MD 11/15/2018, 9:35 AM

## 2018-11-15 NOTE — BHH Group Notes (Signed)
LCSW Group Therapy Note 11/15/2018  1:00 PM   Type of Therapy and Topic: Group Therapy: Feelings Around Returning Home & Establishing a Supportive Framework and Supporting Oneself When Supports Not Available   Participation Level: Active   Description of Group:  Patients first processed thoughts and feelings about upcoming discharge. These included fears of upcoming changes, lack of change, new living environments, judgements and expectations from others and overall stigma of mental health issues. The group then discussed the definition of a supportive framework, what that looks and feels like, and how do to discern it from an unhealthy non-supportive network. The group identified different types of supports as well as what to do when your family/friends are less than helpful or unavailable   Therapeutic Goals  1. Patient will identify one healthy supportive network that they can use at discharge. 2. Patient will identify one factor of a supportive framework and how to tell it from an unhealthy network. 3. Patient able to identify one coping skill to use when they do not have positive supports from others. 4. Patient will demonstrate ability to communicate their needs through discussion and/or role plays.   Summary of Patient Progress:  Patient was an active participant in group.  Patient shared how she has struggled with developing an appropriate relationship with her mother.  CSW discussed with the patient establishing boundaries and patient appeared receptive to the information.       Therapeutic Modalities Cognitive Behavioral Therapy Motivational Interviewing  Assunta Curtis, MSW, LCSW 11/15/2018 10:37 AM

## 2018-11-15 NOTE — Plan of Care (Signed)
Cooperative and compliant with treatment. Reports that she is eating well. Feeling a little bit sleepy, but able to program in the milieu. Denying thoughts of self harm. Denying hallucinations. Has no concern so far.
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
## 2018-11-15 NOTE — Progress Notes (Signed)
D: Patient has been calm and cooperative. Denies SI, HI and AVH. Has been somewhat attention-seeking. Complaining of irritability and anxiety. Medicated per prn order. Mood is pleasant overall. Affect is appropriate to circumstance. A: Continue to monitor for safety R: Safety maintained.

## 2018-11-15 NOTE — Plan of Care (Signed)
  Problem: Coping: Goal: Ability to verbalize frustrations and anger appropriately will improve Outcome: Progressing Goal: Ability to demonstrate self-control will improve Outcome: Progressing  D: Patient has been calm and cooperative. Denies SI, HI and AVH. Has been somewhat attention-seeking. Complaining of irritability and anxiety. Medicated per prn order. Mood is pleasant overall. Affect is appropriate to circumstance. A: Continue to monitor for safety R: Safety maintained.

## 2018-11-16 MED ORDER — TRAZODONE HCL 100 MG PO TABS: 100.0000 mg | ORAL_TABLET | Freq: Every evening | ORAL | 2 refills | Status: DC | PRN

## 2018-11-16 MED ORDER — FLUOXETINE HCL 20 MG PO CAPS: 20.0000 mg | ORAL_CAPSULE | Freq: Every day | ORAL | 2 refills | Status: DC

## 2018-11-16 MED ORDER — HYDROXYZINE HCL 25 MG PO TABS: 25.0000 mg | ORAL_TABLET | Freq: Three times a day (TID) | ORAL | 2 refills | Status: AC | PRN

## 2018-11-16 MED ORDER — CARBAMAZEPINE 200 MG PO TABS: 200.0000 mg | ORAL_TABLET | Freq: Two times a day (BID) | ORAL | 2 refills | Status: DC

## 2018-11-16 NOTE — Progress Notes (Signed)
D: Patient has been pleasant and cooperative. Denies SI, HI and AVH. Contracts for safety A: Continue to monitor for safety R: Safety maintained.

## 2018-11-16 NOTE — Progress Notes (Signed)
Recreation Therapy Notes  INPATIENT RECREATION TR PLAN  Patient Details Name: Carla Braun MRN: [id number] DOB: Jun 28, 1995 Today's Date: 11/16/2018  Rec Therapy Plan Is patient appropriate for Therapeutic Recreation?: Yes Treatment times per week: at least 3 Estimated Length of Stay: 5-7 days TR Treatment/Interventions: Group participation (Comment)  Discharge Criteria Pt will be discharged from therapy if:: Discharged Treatment plan/goals/alternatives discussed and agreed upon by:: Patient/family  Discharge Summary Short term goals set: Patient will engage in groups without prompting or encouragement from LRT x3 group sessions within 5 recreation therapy group sessions Short term goals met: Complete Progress toward goals comments: Groups attended Which groups?: Communication, Leisure education, Other (Comment)(Relaxation) Reason goals not met: N/A Therapeutic equipment acquired: N/A Reason patient discharged from therapy: Discharge from hospital Pt/family agrees with progress & goals achieved: Yes Date patient discharged from therapy: 11/16/18   Idris Edmundson 11/16/2018, 12:09 PM

## 2018-11-16 NOTE — Plan of Care (Signed)
  Problem: Coping: Goal: Ability to verbalize frustrations and anger appropriately will improve Outcome: Progressing Goal: Ability to demonstrate self-control will improve Outcome: Progressing  D: Patient has been pleasant and cooperative. Denies SI, HI and AVH. Contracts for safety A: Continue to monitor for safety R: Safety maintained.

## 2018-11-16 NOTE — Discharge Summary (Signed)
Physician Discharge Summary Note  Patient:  Carla Braun is an 23 y.o., female MRN:  [id number] DOB:  01/01/1996 Patient phone:  [phone] (home)  Patient address:   [address] [place] [address],  Total Time spent with patient: 45 minutes  Date of Admission:  11/11/2018 Date of Discharge: November 16, 2018  Reason for Admission: Patient was admitted to the hospital when she presented with suicidal ideation and mood instability and recent cutting of her wrist with worsening depression  Principal Problem: Bipolar 2 disorder [facility](HCC) Discharge Diagnoses: Principal Problem:   Bipolar 2 disorder (HCC) Active Problems:   MDD (major depressive disorder), severe (HCC)   Self-inflicted laceration of wrist   Borderline personality disorder (HCC)   Past Psychiatric History: History of chronic mood instability diagnoses of bipolar and depression also likely borderline personality disorder from her description.  Past Medical History:  Past Medical History:  Diagnosis Date  . Abdominal pain   . Inflammation of small intestine    per CT   History reviewed. No pertinent surgical history. Family History: History reviewed. No pertinent family history. Family Psychiatric  History: See previous Social History:  Social History   Substance and Sexual Activity  Alcohol Use No     Social History   Substance and Sexual Activity  Drug Use No    Social History   Socioeconomic History  . Marital status: Single    Spouse name: Not on file  . Number of children: Not on file  . Years of education: Not on file  . Highest education level: Not on file  Occupational History  . Not on file  Social Needs  . Financial resource strain: Not on file  . Food insecurity    Worry: Not on file    Inability: Not on file  . Transportation needs    Medical: Not on file    Non-medical: Not on file  Tobacco Use  . Smoking status: Current Every Day Smoker    Packs/day: 0.50     Types: Cigarettes  . Smokeless tobacco: Never Used  Substance and Sexual Activity  . Alcohol use: No  . Drug use: No  . Sexual activity: Yes    Birth control/protection: I.U.D.  Lifestyle  . Physical activity    Days per week: Not on file    Minutes per session: Not on file  . Stress: Not on file  Relationships  . Social Musicianconnections    Talks on phone: Not on file    Gets together: Not on file    Attends religious service: Not on file    Active member of club or organization: Not on file    Attends meetings of clubs or organizations: Not on file    Relationship status: Not on file  Other Topics Concern  . Not on file  Social History Narrative   12th grade completed    Hospital Course: Patient admitted to the psychiatric unit.  15-minute checks employed.  Patient did not display any dangerous or violent or suicidal behavior on the unit.  She was cooperative with treatment.  Patient attended groups and interacted with peers and staff appropriately.  Medications were addressed and the patient was started on Tegretol and fluoxetine medicines that would be more affordable.  Tolerated medicine well no side effects.  Patient was discharged with a plan that she continue with outpatient mental health treatment with her therapist and work on getting started in with a new psychiatrist  Physical Findings: AIMS:  , ,  ,  ,  CIWA:    COWS:     Musculoskeletal: Strength & Muscle Tone: within normal limits Gait & Station: normal Patient leans: N/A  Psychiatric Specialty Exam: Physical Exam  Nursing note and vitals reviewed. Constitutional: She appears well-developed and well-nourished.  HENT:  Head: Normocephalic and atraumatic.  Eyes: Pupils are equal, round, and reactive to light. Conjunctivae are normal.  Neck: Normal range of motion.  Cardiovascular: Regular rhythm and normal heart sounds.  Respiratory: Effort normal. No respiratory distress.  GI: Soft.  Musculoskeletal: Normal  range of motion.  Neurological: She is alert.  Skin: Skin is warm and dry.  Psychiatric: She has a normal mood and affect. Her speech is normal and behavior is normal. Judgment and thought content normal. Cognition and memory are normal.    Review of Systems  Constitutional: Negative.   HENT: Negative.   Eyes: Negative.   Respiratory: Negative.   Cardiovascular: Negative.   Gastrointestinal: Negative.   Musculoskeletal: Negative.   Skin: Negative.   Neurological: Negative.   Psychiatric/Behavioral: Negative.     Blood pressure 115/62, pulse 73, temperature 99 F (37.2 C), temperature source Oral, resp. rate 16, height 5\' 5"  (1.651 m), weight 83.5 kg, SpO2 99 %.Body mass index is 30.62 kg/m.  General Appearance: Casual  Eye Contact:  Fair  Speech:  Normal Rate  Volume:  Normal  Mood:  Euthymic  Affect:  Congruent  Thought Process:  Goal Directed  Orientation:  Full (Time, Place, and Person)  Thought Content:  Logical  Suicidal Thoughts:  No  Homicidal Thoughts:  No  Memory:  Immediate;   Fair Recent;   Fair Remote;   Fair  Judgement:  Fair  Insight:  Fair  Psychomotor Activity:  Decreased  Concentration:  Concentration: Fair  Recall:  [place] of Knowledge:  Fair  Language:  Fair  Akathisia:  No  Handed:  Right  AIMS (if indicated):     Assets:  Desire for Improvement Housing Physical Health Social Support  ADL's:  Intact  Cognition:  WNL  Sleep:  Number of Hours: 6.25     Have you used any form of tobacco in the last 30 days? (Cigarettes, Smokeless Tobacco, Cigars, and/or Pipes): Yes  Has this patient used any form of tobacco in the last 30 days? (Cigarettes, Smokeless Tobacco, Cigars, and/or Pipes) Yes, Yes, A prescription for an FDA-approved tobacco cessation medication was offered at discharge and the patient refused  Blood Alcohol level:  Lab Results  Component Value Date   ETH <10 47/11/6281    Metabolic Disorder Labs:  Lab Results  Component  Value Date   HGBA1C 4.9 03/16/2018   MPG 93.93 03/16/2018   MPG 103 04/14/2011   No results found for: PROLACTIN Lab Results  Component Value Date   CHOL 96 03/16/2018   TRIG 103 03/16/2018   HDL 36 (L) 03/16/2018   CHOLHDL 2.7 03/16/2018   VLDL 21 03/16/2018   LDLCALC 39 03/16/2018   LDLCALC 29 04/14/2011    See Psychiatric Specialty Exam and Suicide Risk Assessment completed by Attending Physician prior to discharge.  Discharge destination:  Home  Is patient on multiple antipsychotic therapies at discharge:  No   Has Patient had three or more failed trials of antipsychotic monotherapy by history:  No  Recommended Plan for Multiple Antipsychotic Therapies: NA  Discharge Instructions    Diet - low sodium heart healthy   Complete by: As directed    Increase activity slowly   Complete by: As directed  Allergies as of 11/16/2018      Reactions   Food    Lactose   Other       Medication List    STOP taking these medications   carbamazepine 100 MG chewable tablet Commonly known as: TEGRETOL Replaced by: carbamazepine 200 MG tablet   ergocalciferol 1.25 MG (50000 UT) capsule Commonly known as: VITAMIN D2   Fiber Chew   levonorgestrel 20 MCG/24HR IUD Commonly known as: MIRENA   metFORMIN 500 MG tablet Commonly known as: GLUCOPHAGE   vortioxetine HBr 5 MG Tabs tablet Commonly known as: TRINTELLIX     TAKE these medications     Indication  carbamazepine 200 MG tablet Commonly known as: TEGRETOL Take 1 tablet (200 mg total) by mouth 2 (two) times daily. Replaces: carbamazepine 100 MG chewable tablet  Indication: Manic-Depression   FLUoxetine 20 MG capsule Commonly known as: PROZAC Take 1 capsule (20 mg total) by mouth daily.  Indication: Depression   hydrOXYzine 25 MG tablet Commonly known as: ATARAX/VISTARIL Take 1 tablet (25 mg total) by mouth 3 (three) times daily as needed for anxiety.  Indication: Feeling Anxious   traZODone 100 MG  tablet Commonly known as: DESYREL Take 1 tablet (100 mg total) by mouth at bedtime as needed for sleep.  Indication: Trouble Sleeping      Follow-up [employer] Follow up on 11/17/2018.   Why: [facility] [place] on Tuesday 11/17/18 at 9:45am. You will need to take your hospital discharge paperwork, ID, social security card, insurance card, proof of income, proof of residency with you. Please wear a mask. Contact information: [address] [place] [address] [phone]           Follow-up recommendations:  Activity:  Activity as tolerated Diet:  Regular diet Other:  Follow-up outpatient treatment in the community  Comments: Patient showed good response to treatment.  No problems with mood or affect or behavior while in the hospital.  At time of discharge denied any suicidal ideation  Signed: Mordecai RasmussenJohn Randale Carvalho, MD 11/16/2018, 4:57 PM

## 2018-11-16 NOTE — BHH Suicide Risk Assessment (Signed)
[facility] Discharge Suicide Risk Assessment   Principal Problem: Bipolar 2 disorder [facility]) Discharge Diagnoses: Principal Problem:   Bipolar 2 disorder ([place]) Active Problems:   MDD (major depressive disorder), severe ([place])   Self-inflicted laceration of wrist   Borderline personality disorder ([place])   Total Time spent with patient: 45 minutes  Musculoskeletal: Strength & Muscle Tone: within normal limits Gait & Station: normal Patient leans: N/A  Psychiatric Specialty Exam: Review of Systems  Constitutional: Negative.   HENT: Negative.   Eyes: Negative.   Respiratory: Negative.   Cardiovascular: Negative.   Gastrointestinal: Negative.   Musculoskeletal: Negative.   Skin: Negative.   Neurological: Negative.   Psychiatric/Behavioral: Negative.     Blood pressure 115/62, pulse 73, temperature 99 F (37.2 C), temperature source Oral, resp. rate 16, height 5\' 5"  (1.651 m), weight 83.5 kg, SpO2 99 %.Body mass index is 30.62 kg/m.  General Appearance: Casual  Eye Contact::  Good  Speech:  Clear and [id number]  Volume:  Normal  Mood:  Euthymic  Affect:  Congruent  Thought Process:  Goal Directed  Orientation:  Full (Time, Place, and Person)  Thought Content:  Logical  Suicidal Thoughts:  No  Homicidal Thoughts:  No  Memory:  Immediate;   Fair Recent;   Fair Remote;   Fair  Judgement:  Fair  Insight:  Fair  Psychomotor Activity:  Normal  Concentration:  Fair  Recall:  [employer] of Knowledge:Fair  Language: Fair  Akathisia:  No  Handed:  Right  AIMS (if indicated):     Assets:  Desire for Improvement Physical Health Resilience  Sleep:  Number of Hours: 6.25  Cognition: WNL  ADL's:  Intact   Mental Status Per Nursing Assessment::   On Admission:  Self-harm thoughts  Demographic Factors:  Caucasian and Living alone  Loss Factors: Financial problems/change in socioeconomic status  Historical Factors: Prior suicide attempts and Impulsivity  Risk Reduction  Factors:   Positive social support and Positive therapeutic relationship  Continued Clinical Symptoms:  Depression:   Impulsivity  Cognitive Features That Contribute To Risk:  None    Suicide Risk:  Minimal: No identifiable suicidal ideation.  Patients presenting with no risk factors but with morbid ruminations; may be classified as minimal risk based on the severity of the depressive symptoms  Follow-up [place], [facility] Follow up on 11/17/2018.   Why: Follow up with Neva Seat on Tuesday 11/17/18 at 9:45am. You will need to take your hospital discharge paperwork, ID, social security card, insurance card, proof of income, proof of residency with you. Please wear a mask. Contact information: [place] [address] [phone]           Plan Of Care/Follow-up recommendations:  Activity:  Activity as tolerated Diet:  Regular diet Other:  [facility], MD 11/16/2018, 9:09 AM

## 2018-11-16 NOTE — Progress Notes (Signed)
Recreation Therapy Notes   Date: 11/16/2018  Time: 9:30 am  Location: Craft room  Behavioral response: Appropriate   Intervention Topic: Communication   Discussion/Intervention:  Group content today was focused on communication. The group defined communication and ways to communicate with others. Individuals stated reason why communication is important and some reasons to communicate with others. Patients expressed if they thought they were good at communicating with others and ways they could improve their communication skills. The group identified important parts of communication and some experiences they have had in the past with communication. The group participated in the intervention "What is that?", where they had a chance to test out their communication skills and identify ways to improve their communication techniques.  Clinical Observations/Feedback:  Patient came to group and was social with peers and staff during group. She left group early to be discharged.  Margarethe Virgen LRT/CTRS         Kenadi Miltner 11/16/2018 12:04 PM

## 2018-11-16 NOTE — Progress Notes (Signed)
  [facility] Adult Case Management Discharge Plan :  Will you be returning to the same living situation after discharge:  Yes,  home At discharge, do you have transportation home?: Yes,  mother will pick pt up at [place] you have the ability to pay for your medications: Yes,  [employer] insurance  Release of information consent forms completed and in the chart;    Patient to Follow up at: Follow-up [place] Follow up on 11/17/2018.   Why: Follow up with Neva Seat on Tuesday 11/17/18 at 9:45am. You will need to take your hospital discharge paperwork, ID, social security card, insurance card, proof of income, proof of residency with you. Please wear a mask. Contact information: [place] [address] [phone]           Next level of care provider has access to [place] and Suicide Prevention discussed: Yes,  SPE completed with pt as pt declined collateral contact  Have you used any form of tobacco in the last 30 days? (Cigarettes, Smokeless Tobacco, Cigars, and/or Pipes): Yes  Has patient been referred to the Quitline?: Patient refused referral  Patient has been referred for addiction treatment: [place], LCSW 11/16/2018, 9:04 AM

## 2018-11-16 NOTE — Progress Notes (Signed)
Patient denies SI/HI, denies A/V hallucinations. Patient verbalizes understanding of discharge instructions, follow up care and prescriptions. Patient given all belongings from [facility] locker. Patient escorted out by staff, transported by family. 

## 2019-02-20 ENCOUNTER — Other Ambulatory Visit: Payer: Self-pay | Admitting: Psychiatry

## 2021-07-23 ENCOUNTER — Other Ambulatory Visit: Payer: Self-pay

## 2021-07-23 ENCOUNTER — Encounter (HOSPITAL_COMMUNITY): Payer: Self-pay | Admitting: *Deleted

## 2021-07-23 ENCOUNTER — Emergency Department (HOSPITAL_COMMUNITY): Payer: BC Managed Care – PPO

## 2021-07-23 ENCOUNTER — Emergency Department (HOSPITAL_COMMUNITY)
Admission: EM | Admit: 2021-07-23 | Discharge: 2021-07-23 | Disposition: A | Discharge status: Home / Self Care | Payer: BC Managed Care – PPO | Attending: Emergency Medicine | Admitting: Emergency Medicine

## 2021-07-23 DIAGNOSIS — M545 Low back pain, unspecified: Secondary | ICD-10-CM | POA: Diagnosis not present

## 2021-07-23 DIAGNOSIS — R519 Headache, unspecified: Secondary | ICD-10-CM | POA: Insufficient documentation

## 2021-07-23 DIAGNOSIS — Y92410 Unspecified street and highway as the place of occurrence of the external cause: Secondary | ICD-10-CM | POA: Diagnosis not present

## 2021-07-23 DIAGNOSIS — M542 Cervicalgia: Secondary | ICD-10-CM | POA: Insufficient documentation

## 2021-07-23 DIAGNOSIS — S80211A Abrasion, right knee, initial encounter: Secondary | ICD-10-CM | POA: Diagnosis not present

## 2021-07-23 DIAGNOSIS — S8991XA Unspecified injury of right lower leg, initial encounter: Secondary | ICD-10-CM | POA: Diagnosis present

## 2021-07-23 DIAGNOSIS — R0789 Other chest pain: Secondary | ICD-10-CM | POA: Diagnosis not present

## 2021-07-23 HISTORY — DX: Bipolar II disorder: F31.81

## 2021-07-23 HISTORY — DX: Anxiety disorder, unspecified: F41.9

## 2021-07-23 HISTORY — DX: Depression, unspecified: F32.A

## 2021-07-23 LAB — POC URINE PREG, ED: Preg Test, Ur: NEGATIVE

## 2021-07-23 MED ORDER — IBUPROFEN 600 MG PO TABS: 600.0000 mg | ORAL_TABLET | Freq: Four times a day (QID) | ORAL | 0 refills | Status: AC | PRN

## 2021-07-23 MED ORDER — METHOCARBAMOL 500 MG PO TABS: 500.0000 mg | ORAL_TABLET | Freq: Two times a day (BID) | ORAL | 0 refills | Status: AC
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
## 2021-07-23 NOTE — ED Notes (Signed)
I provided reinforced discharge education based off of discharge instructions. Pt acknowledged and understood my education. Pt had no further questions/concerns for provider/myself.  °

## 2021-07-23 NOTE — Discharge Instructions (Addendum)
